# Patient Record
Sex: Female | Born: 1950 | Race: White | Hispanic: No
Health system: Southern US, Community
[De-identification: ages and names within clinical notes are randomized; demographics above are authoritative.]

## PROBLEM LIST (undated history)

## (undated) DIAGNOSIS — E559 Vitamin D deficiency, unspecified: Secondary | ICD-10-CM

## (undated) DIAGNOSIS — E785 Hyperlipidemia, unspecified: Secondary | ICD-10-CM

## (undated) DIAGNOSIS — R7303 Prediabetes: Secondary | ICD-10-CM

## (undated) DIAGNOSIS — F419 Anxiety disorder, unspecified: Secondary | ICD-10-CM

## (undated) DIAGNOSIS — C539 Malignant neoplasm of cervix uteri, unspecified: Secondary | ICD-10-CM

## (undated) DIAGNOSIS — M1990 Unspecified osteoarthritis, unspecified site: Secondary | ICD-10-CM

## (undated) DIAGNOSIS — C50412 Malignant neoplasm of upper-outer quadrant of left female breast: Secondary | ICD-10-CM

## (undated) DIAGNOSIS — C50919 Malignant neoplasm of unspecified site of unspecified female breast: Secondary | ICD-10-CM

## (undated) DIAGNOSIS — L309 Dermatitis, unspecified: Secondary | ICD-10-CM

## (undated) DIAGNOSIS — F32A Depression, unspecified: Secondary | ICD-10-CM

## (undated) DIAGNOSIS — E669 Obesity, unspecified: Secondary | ICD-10-CM

## (undated) DIAGNOSIS — F329 Major depressive disorder, single episode, unspecified: Secondary | ICD-10-CM

## (undated) DIAGNOSIS — I10 Essential (primary) hypertension: Secondary | ICD-10-CM

## (undated) DIAGNOSIS — T7840XA Allergy, unspecified, initial encounter: Secondary | ICD-10-CM

## (undated) HISTORY — DX: Allergy, unspecified, initial encounter: T78.40XA

## (undated) HISTORY — DX: Unspecified osteoarthritis, unspecified site: M19.90

## (undated) HISTORY — DX: Hyperlipidemia, unspecified: E78.5

## (undated) HISTORY — PX: TUBAL LIGATION: SHX77

## (undated) HISTORY — PX: ABDOMINAL HYSTERECTOMY: SHX81

## (undated) HISTORY — DX: Vitamin D deficiency, unspecified: E55.9

## (undated) HISTORY — DX: Essential (primary) hypertension: I10

## (undated) HISTORY — DX: Obesity, unspecified: E66.9

## (undated) HISTORY — DX: Anxiety disorder, unspecified: F41.9

## (undated) HISTORY — DX: Prediabetes: R73.03

## (undated) HISTORY — PX: CHOLECYSTECTOMY: SHX55

## (undated) HISTORY — DX: Dermatitis, unspecified: L30.9

## (undated) HISTORY — DX: Malignant neoplasm of cervix uteri, unspecified: C53.9

## (undated) HISTORY — DX: Malignant neoplasm of upper-outer quadrant of left female breast: C50.412

## (undated) HISTORY — DX: Depression, unspecified: F32.A

## (undated) HISTORY — DX: Major depressive disorder, single episode, unspecified: F32.9

---

## 1996-09-16 HISTORY — PX: ROUX-EN-Y PROCEDURE: SUR1287

## 2009-10-04 LAB — HM COLONOSCOPY: HM Colonoscopy: NEGATIVE

## 2011-10-05 LAB — HM MAMMOGRAPHY: HM Mammogram: NEGATIVE

## 2013-11-03 ENCOUNTER — Encounter: Payer: Self-pay | Admitting: Physician Assistant

## 2013-11-03 DIAGNOSIS — E669 Obesity, unspecified: Secondary | ICD-10-CM

## 2013-11-03 DIAGNOSIS — F32A Depression, unspecified: Secondary | ICD-10-CM

## 2013-11-03 DIAGNOSIS — T7840XA Allergy, unspecified, initial encounter: Secondary | ICD-10-CM

## 2013-11-03 DIAGNOSIS — E559 Vitamin D deficiency, unspecified: Secondary | ICD-10-CM

## 2013-11-03 DIAGNOSIS — F329 Major depressive disorder, single episode, unspecified: Secondary | ICD-10-CM

## 2013-11-03 DIAGNOSIS — M1990 Unspecified osteoarthritis, unspecified site: Secondary | ICD-10-CM

## 2013-11-03 DIAGNOSIS — E785 Hyperlipidemia, unspecified: Secondary | ICD-10-CM

## 2013-11-03 DIAGNOSIS — I10 Essential (primary) hypertension: Secondary | ICD-10-CM

## 2013-11-04 ENCOUNTER — Encounter: Payer: Self-pay | Admitting: Physician Assistant

## 2013-11-04 DIAGNOSIS — E1169 Type 2 diabetes mellitus with other specified complication: Secondary | ICD-10-CM | POA: Insufficient documentation

## 2013-11-04 DIAGNOSIS — Z9109 Other allergy status, other than to drugs and biological substances: Secondary | ICD-10-CM | POA: Insufficient documentation

## 2013-11-04 DIAGNOSIS — E559 Vitamin D deficiency, unspecified: Secondary | ICD-10-CM | POA: Insufficient documentation

## 2013-11-04 DIAGNOSIS — F32A Depression, unspecified: Secondary | ICD-10-CM | POA: Insufficient documentation

## 2013-11-04 DIAGNOSIS — E785 Hyperlipidemia, unspecified: Secondary | ICD-10-CM

## 2013-11-04 DIAGNOSIS — I10 Essential (primary) hypertension: Secondary | ICD-10-CM | POA: Insufficient documentation

## 2013-11-04 DIAGNOSIS — M1990 Unspecified osteoarthritis, unspecified site: Secondary | ICD-10-CM | POA: Insufficient documentation

## 2013-11-04 DIAGNOSIS — F329 Major depressive disorder, single episode, unspecified: Secondary | ICD-10-CM | POA: Insufficient documentation

## 2013-11-04 NOTE — Progress Notes (Signed)
Complete Physical HPI 63 y.o. female  presents for a complete physical. Her blood pressure has been controlled at home, today their BP is BP: 122/78 mmHg She denies chest pain, shortness of breath, dizziness.  Her cholesterol is diet controlled. In addition they are on Lipitor and denies myalgias. Her cholesterol is controlled.  She has been working on diet and exercise for prediabetes, and denies blurry vision, polydipsia, polyphagia and polyuria.  Patient is on Vitamin D supplement.    Current Medications:  Current Outpatient Prescriptions on File Prior to Visit  Medication Sig Dispense Refill  . acetaminophen (TYLENOL) 650 MG CR tablet Take 650 mg by mouth every 8 (eight) hours as needed for pain.      Marland Kitchen atorvastatin (LIPITOR) 20 MG tablet Take 20 mg by mouth daily.      . cetirizine (ZYRTEC) 10 MG tablet Take 10 mg by mouth daily.      Marland Kitchen losartan-hydrochlorothiazide (HYZAAR) 50-12.5 MG per tablet Take 1 tablet by mouth daily.      Marland Kitchen neomycin-polymyxin-hydrocortisone (CORTISPORIN) otic solution Place 3 drops into both ears 4 (four) times daily.      . sertraline (ZOLOFT) 100 MG tablet Take 300 mg by mouth daily.      . traZODone (DESYREL) 100 MG tablet Take 200 mg by mouth at bedtime.      . Vitamin D, Ergocalciferol, (DRISDOL) 50000 UNITS CAPS capsule Take 50,000 Units by mouth every 30 (thirty) days.       No current facility-administered medications on file prior to visit.   Health Maintenance:  Tetanus: unknown, likely greater than 10 years Pneumovax: N/A Flu vaccine: 06/2013 Zostavax: N/A Pap: not needed MGM: overdue DEXA: N/A Colonoscopy: will get records- per patient every 5 years and due in 1-2 years.  EGD:N/A  Allergies:  Allergies  Allergen Reactions  . Lisinopril     Diarrhea   Medical History:  Past Medical History  Diagnosis Date  . Hypertension   . Hyperlipidemia   . Obesity   . Allergy   . Depression   . Arthritis   . Cancer     Cervical   .  Vitamin D deficiency   . Eczema    Surgical History:  Past Surgical History  Procedure Laterality Date  . Cholecystectomy    . Abdominal hysterectomy      Partial, due to cervical cancer  . Roux-en-y procedure  1998  . Tubal ligation     Family History:  Family History  Problem Relation Age of Onset  . Stroke Mother   . Hyperlipidemia Mother   . Hypertension Mother   . Depression Mother   . Cancer Father     Melanoma  . Cancer Sister     bone   . Hypertension Sister   . Hyperlipidemia Sister   . Depression Sister   . Depression Brother   . Heart disease Brother   . Hyperlipidemia Brother   . Hypertension Brother   . COPD Brother   . Cancer Sister 73    breast cancer double masectomy  . Depression Sister   . Depression Sister    Social History:  History   Social History  . Marital Status: Married    Spouse Name: N/A    Number of Children: N/A  . Years of Education: N/A   Occupational History  . Not on file.   Social History Main Topics  . Smoking status: Passive Smoke Exposure - Never Smoker  . Smokeless tobacco: Never Used  .  Alcohol Use: No  . Drug Use: No  . Sexual Activity: Not Currently    Birth Control/ Protection: Post-menopausal   Other Topics Concern  . Not on file   Social History Narrative  . No narrative on file    Review of Systems: [X]  = yes, [ ]  = no   General: Fatigue [ ] ; Fever [ ] ; Chills [ ] ; Weakness [ ]   Insomnia [ ]  Eyes: Redness [ ]  Blurred vision [ ]  Diplopia [ ]   ENT: Congestion [ ]  Sinus Pain [ ]  Post Nasal Drip [ ]  Sore Throat [ ]  Earache [ ]   Cardiac: Chest pain/pressure [ ] ; SOB [ ] ; Orthopnea [ ] ;  Edema [ ] ; Palpitations [ ] ;  Paroxysmal nocturnal dyspnea[ ]  Claudication [ ]   Pulmonary: Cough [ ] ; Wheezing[ ] ; SOB [ ]   Snoring [ ]   GI: Nausea [ ]  Vomiting[ ] ; Dysphagia[ ] ; Heartburn[ ] ; Abdominal pain [ ] ; Constipation [ ] ; Diarrhea [ ] ; BRBPR [ ]  Melena[ ]  GU: Hematuria[ ] ; Dysuria [ ] ; Nocturia[ ]  Urgency [ ]    Hesitancy [ ]  Discharge [ ]  Neuro: Headaches[ ] ; Vertigo[ ] ; Seizures[ ] ; Paresthesias[ ] ;Blurred vision [ ] ; Diplopia [ ] ; Vision changes [ ]   Ortho: Arthritis Valu.Nieves ]; Joint pain [ ] ; Muscle pain [ ] ; Joint swelling [ ] ; Back Pain [ ] ; Skin:  Rash [ ]   Pruritis [ ]  Change in skin lesion [ ]   Psych: Depression[ ] ; Anxiety[ ]  confusion [ ]  Memory loss [ ]   Heme/Lypmh: Bleeding [ ] ; Bruising [ ] ; Enlarged lymph nodes [ ]   Endocrine: visual blurring [ ]  paresthesia [ ]  polyuria [ ]  polydypsea [ ]    Physical Exam: Estimated body mass index is 42.35 kg/(m^2) as calculated from the following:   Height as of this encounter: 5\' 3"  (1.6 m).   Weight as of this encounter: 239 lb (108.41 kg). Filed Vitals:   11/05/13 1127  BP: 122/78  Pulse: 72  Temp: 97.9 F (36.6 C)  Resp: 16   General Appearance: Well nourished, in no apparent distress. Eyes: PERRLA, EOMs, conjunctiva no swelling or erythema, normal fundi and vessels. Sinuses: No Frontal/maxillary tenderness ENT/Mouth: Ext aud canals clear, normal light reflex with TMs without erythema, bulging.  Good dentition. No erythema, swelling, or exudate on post pharynx. Tonsils not swollen or erythematous. Hearing normal.  Neck: Supple, thyroid normal. No bruits Respiratory: Respiratory effort normal, BS equal bilaterally without rales, rhonchi, wheezing or stridor. Cardio: RRR without murmurs, rubs or gallops. Brisk peripheral pulses without edema.  Chest: symmetric, with normal excursions and percussion. Breasts: Symmetric, without lumps, nipple discharge, retractions. Erythrema under bilateral breast Abdomen: Soft, obese, +BS. Non tender, no guarding, rebound, hernias, masses, or organomegaly. .  Lymphatics: Non tender without lymphadenopathy.  Genitourinary: defer Musculoskeletal: Full ROM all peripheral extremities,5/5 strength, and normal gait. Skin: Warm, dry without rashes, lesions, ecchymosis.  Neuro: Cranial nerves intact, reflexes equal  bilaterally. Normal muscle tone, no cerebellar symptoms. Sensation intact.  Psych: Awake and oriented X 3, normal affect, Insight and Judgment appropriate.   EKG: WNL no changes.  Assessment and Plan: Hypertension- continue meds  Hyperlipidemia- check level, continue meds for now  Obesity- phentermine 37.5  Allergy- OTC PRN  Depression- continue zoloft  Arthritis- states controlled with tylenol  Vitamin D deficiency- check level  Eczema- mainly in ear, controlled  Yeast- nystatin powder TDAP MGM Colonoscopy- get records, likely in 1-2 years.   Discussed med's effects and SE's. Screening labs and tests  as requested with regular follow-up as recommended.   Brooke Hoover 11:52 AM

## 2013-11-05 ENCOUNTER — Ambulatory Visit (INDEPENDENT_AMBULATORY_CARE_PROVIDER_SITE_OTHER): Payer: BC Managed Care – PPO | Admitting: Physician Assistant

## 2013-11-05 ENCOUNTER — Encounter: Payer: Self-pay | Admitting: Physician Assistant

## 2013-11-05 VITALS — BP 122/78 | HR 72 | Temp 97.9°F | Resp 16 | Ht 63.0 in | Wt 239.0 lb

## 2013-11-05 DIAGNOSIS — Z79899 Other long term (current) drug therapy: Secondary | ICD-10-CM

## 2013-11-05 DIAGNOSIS — E559 Vitamin D deficiency, unspecified: Secondary | ICD-10-CM

## 2013-11-05 DIAGNOSIS — Z0000 Encounter for general adult medical examination without abnormal findings: Secondary | ICD-10-CM

## 2013-11-05 DIAGNOSIS — Z23 Encounter for immunization: Secondary | ICD-10-CM

## 2013-11-05 LAB — BASIC METABOLIC PANEL WITH GFR
BUN: 10 mg/dL (ref 6–23)
CALCIUM: 9.4 mg/dL (ref 8.4–10.5)
CO2: 30 mEq/L (ref 19–32)
CREATININE: 0.54 mg/dL (ref 0.50–1.10)
Chloride: 100 mEq/L (ref 96–112)
GFR, Est African American: >89 mL/min
Glucose, Bld: 99 mg/dL (ref 70–99)
Potassium: 4.3 mEq/L (ref 3.5–5.3)
Sodium: 138 mEq/L (ref 135–145)

## 2013-11-05 LAB — HEPATIC FUNCTION PANEL
ALT: 23 U/L (ref 0–35)
AST: 23 U/L (ref 0–37)
Albumin: 4.5 g/dL (ref 3.5–5.2)
Alkaline Phosphatase: 72 U/L (ref 39–117)
BILIRUBIN TOTAL: 0.3 mg/dL (ref 0.2–1.2)
Bilirubin, Direct: 0.1 mg/dL (ref 0.0–0.3)
Indirect Bilirubin: 0.2 mg/dL (ref 0.2–1.2)
Total Protein: 6.6 g/dL (ref 6.0–8.3)

## 2013-11-05 LAB — URIC ACID: URIC ACID, SERUM: 5.2 mg/dL (ref 2.4–7.0)

## 2013-11-05 LAB — LIPID PANEL
CHOLESTEROL: 207 mg/dL — AB (ref 0–200)
HDL: 61 mg/dL (ref >39)
LDL Cholesterol: 110 mg/dL — ABNORMAL HIGH (ref 0–99)
Total CHOL/HDL Ratio: 3.4 Ratio
Triglycerides: 179 mg/dL — ABNORMAL HIGH (ref <150)
VLDL: 36 mg/dL (ref 0–40)

## 2013-11-05 LAB — CBC WITH DIFFERENTIAL/PLATELET
Basophils Absolute: 0 10*3/uL (ref 0.0–0.1)
Basophils Relative: 0 % (ref 0–1)
EOS ABS: 0.1 10*3/uL (ref 0.0–0.7)
EOS PCT: 1 % (ref 0–5)
HCT: 36.1 % (ref 36.0–46.0)
Hemoglobin: 12.5 g/dL (ref 12.0–15.0)
LYMPHS PCT: 33 % (ref 12–46)
Lymphs Abs: 2.1 10*3/uL (ref 0.7–4.0)
MCH: 28.7 pg (ref 26.0–34.0)
MCHC: 34.6 g/dL (ref 30.0–36.0)
MCV: 82.8 fL (ref 78.0–100.0)
Monocytes Absolute: 0.5 10*3/uL (ref 0.1–1.0)
Monocytes Relative: 8 % (ref 3–12)
Neutro Abs: 3.8 10*3/uL (ref 1.7–7.7)
Neutrophils Relative %: 58 % (ref 43–77)
PLATELETS: 320 10*3/uL (ref 150–400)
RBC: 4.36 MIL/uL (ref 3.87–5.11)
RDW: 15.3 % (ref 11.5–15.5)
WBC: 6.5 10*3/uL (ref 4.0–10.5)

## 2013-11-05 LAB — IRON AND TIBC
%SAT: 11 % — ABNORMAL LOW (ref 20–55)
Iron: 50 ug/dL (ref 42–145)
TIBC: 461 ug/dL (ref 250–470)
UIBC: 411 ug/dL — ABNORMAL HIGH (ref 125–400)

## 2013-11-05 LAB — TSH: TSH: 1.314 u[IU]/mL (ref 0.350–4.500)

## 2013-11-05 LAB — HEMOGLOBIN A1C
Hgb A1c MFr Bld: 6.3 % — ABNORMAL HIGH (ref <5.7)
Mean Plasma Glucose: 134 mg/dL — ABNORMAL HIGH (ref <117)

## 2013-11-05 LAB — FERRITIN: FERRITIN: 3 ng/mL — AB (ref 10–291)

## 2013-11-05 LAB — MAGNESIUM: Magnesium: 2 mg/dL (ref 1.5–2.5)

## 2013-11-05 LAB — VITAMIN B12: Vitamin B-12: 1323 pg/mL — ABNORMAL HIGH (ref 211–911)

## 2013-11-05 MED ORDER — PHENTERMINE HCL 37.5 MG PO TABS: 37.5000 mg | ORAL_TABLET | Freq: Every day | ORAL | Status: DC

## 2013-11-05 MED ORDER — NYSTATIN 100000 UNIT/GM EX POWD: CUTANEOUS | Status: DC

## 2013-11-05 NOTE — Patient Instructions (Signed)
Bad carbs also include fruit juice, alcohol, and sweet tea. These are empty calories that do not signal to your brain that you are full.   Please remember the good carbs are still carbs which convert into sugar. So please measure them out no more than 1/2-1 cup of rice, oatmeal, pasta, and beans.  Veggies are however free foods! Pile them on.   I like lean protein at every meal such as chicken, [place], pork chops, cottage cheese, etc. Just do not fry these meats and please center your meal around vegetable, the meats should be a side dish.   No all fruit is created equal. Please see the list below, the fruit at the bottom is higher in sugars than the fruit at the top   We want weight loss that will last so you should lose 1-2 pounds a week.  THAT IS IT! Please pick THREE things a month to change. Once it is a habit check off the item. Then pick another three items off the list to become habits.  If you are already doing a habit on the list GREAT!  Cross that item off! o Don't drink your calories. Ie, alcohol, soda, fruit juice, and sweet tea.  o Drink more water. Drink a glass when you feel hungry or before each meal.  o Eat breakfast - Complex carb and protein (likeDannon light and fit yogurt, oatmeal, fruit, eggs, [place] bacon). o Measure your cereal.  Eat no more than one cup a day. (ie [place]) o Eat an apple a day. o Add a vegetable a day. o Try a new vegetable a month. o Use Pam! Stop using oil or butter to cook. o Don't finish your plate or use smaller plates. o Share your dessert. o Eat sugar free Jello for dessert or frozen grapes. o Don't eat 2-3 hours before bed. o Switch to whole wheat bread, pasta, and brown rice. o Make healthier choices when you eat out. No fries! o Pick baked chicken, NOT fried. o Don't forget to SLOW DOWN when you eat. It is not going anywhere.  o Take the stairs. o Park far away in the parking lot o [employer] (or weights) for 10 minutes while  watching TV. o Walk at work for 10 minutes during break. o Walk outside 1 time a week with your friend, kids, dog, or significant other. o Start a walking group at [place] the mall as much as you can tolerate.  o Keep a food diary. o Weigh yourself daily. o Walk for 15 minutes 3 days per week. o Cook at home more often and eat out less.  If life happens and you go back to old habits, it is okay.  Just start over. You can do it!   If you experience chest pain, get short of breath, or tired during the exercise, please stop immediately and inform your doctor.   Preventative Care for Adults - Female      MAINTAIN REGULAR HEALTH EXAMS:  A routine yearly physical is a good way to check in with your primary care provider about your health and preventive screening. It is also an opportunity to share updates about your health and any concerns you have, and receive a thorough all-over exam.   Most health insurance companies pay for at least some preventative services.  Check with your health plan for specific coverages.  WHAT PREVENTATIVE SERVICES DO WOMEN NEED?  Adult women should have their weight and blood  pressure checked regularly.   Women age 65 and older should have their cholesterol levels checked regularly.  Women should be screened for cervical cancer with a Pap smear and pelvic exam beginning at either age 67, or 3 years after they become sexually activity.    Breast cancer screening generally begins at age 32 with a mammogram and breast exam by your primary care provider.    Beginning at age 52 and continuing to age 65, women should be screened for colorectal cancer.  Certain people may need continued testing until age 73.  Updating vaccinations is part of preventative care.  Vaccinations help protect against diseases such as the flu.  Osteoporosis is a disease in which the bones lose minerals and strength as we age. Women ages 68 and over should discuss this with their  caregivers, as should women after menopause who have other risk factors.  Lab tests are generally done as part of preventative care to screen for anemia and blood disorders, to screen for problems with the kidneys and liver, to screen for bladder problems, to check blood sugar, and to check your cholesterol level.  Preventative services generally include counseling about diet, exercise, avoiding tobacco, drugs, excessive alcohol consumption, and sexually transmitted infections.    GENERAL RECOMMENDATIONS FOR GOOD HEALTH:  Healthy diet:  Eat a variety of foods, including fruit, vegetables, animal or vegetable protein, such as meat, fish, chicken, and eggs, or beans, lentils, tofu, and grains, such as rice.  Drink plenty of water daily.  Decrease saturated fat in the diet, avoid lots of red meat, processed foods, sweets, fast foods, and fried foods.  Exercise:  Aerobic exercise helps maintain good heart health. At least 30-40 minutes of moderate-intensity exercise is recommended. For example, a brisk walk that increases your heart rate and breathing. This should be done on most days of the week.   Find a type of exercise or a variety of exercises that you enjoy so that it becomes a part of your daily life.  Examples are running, walking, swimming, water aerobics, and biking.  For motivation and support, explore group exercise such as aerobic class, spin class, Zumba, Yoga,or  martial arts, etc.    Set exercise goals for yourself, such as a certain weight goal, walk or run in a race such as a 5k walk/run.  Speak to your primary care provider about exercise goals.  Disease prevention:  If you smoke or chew tobacco, find out from your caregiver how to quit. It can literally save your life, no matter how long you have been a tobacco user. If you do not use tobacco, never begin.   Maintain a healthy diet and normal weight. Increased weight leads to problems with blood pressure and diabetes.    The Body Mass Index or BMI is a way of measuring how much of your body is fat. Having a BMI above 27 increases the risk of heart disease, diabetes, hypertension, stroke and other problems related to obesity. Your caregiver can help determine your BMI and based on it develop an exercise and dietary program to help you achieve or maintain this important measurement at a healthful level.  High blood pressure causes heart and blood vessel problems.  Persistent high blood pressure should be treated with medicine if weight loss and exercise do not work.   Fat and cholesterol leaves deposits in your arteries that can block them. This causes heart disease and vessel disease elsewhere in your body.  If your cholesterol is  found to be high, or if you have heart disease or certain other medical conditions, then you may need to have your cholesterol monitored frequently and be treated with medication.   Ask if you should have a cardiac stress test if your history suggests this. A stress test is a test done on a treadmill that looks for heart disease. This test can find disease prior to there being a problem.  Menopause can be associated with physical symptoms and risks. Hormone replacement therapy is available to decrease these. You should talk to your caregiver about whether starting or continuing to take hormones is right for you.   Osteoporosis is a disease in which the bones lose minerals and strength as we age. This can result in serious bone fractures. Risk of osteoporosis can be identified using a bone density scan. Women ages 22 and over should discuss this with their caregivers, as should women after menopause who have other risk factors. Ask your caregiver whether you should be taking a calcium supplement and Vitamin D, to reduce the rate of osteoporosis.   Avoid drinking alcohol in excess (more than two drinks per day).  Avoid use of street drugs. Do not share needles with anyone. Ask for professional  help if you need assistance or instructions on stopping the use of alcohol, cigarettes, and/or drugs.  Brush your teeth twice a day with fluoride toothpaste, and floss once a day. Good oral hygiene prevents tooth decay and gum disease. The problems can be painful, unattractive, and can cause other health problems. Visit your dentist for a routine oral and dental check up and preventive care every 6-12 months.   Look at your skin regularly.  Use a mirror to look at your back. Notify your caregivers of changes in moles, especially if there are changes in shapes, colors, a size larger than a pencil eraser, an irregular border, or development of new moles.  Safety:  Use seatbelts 100% of the time, whether driving or as a passenger.  Use safety devices such as hearing protection if you work in environments with loud noise or significant background noise.  Use safety glasses when doing any work that could send debris in to the eyes.  Use a helmet if you ride a bike or motorcycle.  Use appropriate safety gear for contact sports.  Talk to your caregiver about gun safety.  Use sunscreen with a SPF (or skin protection factor) of 15 or greater.  Lighter skinned people are at a greater risk of skin cancer. Don't forget to also wear sunglasses in order to protect your eyes from too much damaging sunlight. Damaging sunlight can accelerate cataract formation.   Practice safe sex. Use condoms. Condoms are used for birth control and to help reduce the spread of sexually transmitted infections (or STIs).  Some of the STIs are gonorrhea (the clap), chlamydia, syphilis, trichomonas, herpes, HPV (human papilloma virus) and HIV (human immunodeficiency virus) which causes AIDS. The herpes, HIV and HPV are viral illnesses that have no cure. These can result in disability, cancer and death.   Keep carbon monoxide and smoke detectors in your home functioning at all times. Change the batteries every 6 months or use a model that  plugs into the wall.   Vaccinations:  Stay up to date with your tetanus shots and other required immunizations. You should have a booster for tetanus every 10 years. Be sure to get your flu shot every year, since 5%-20% of the [place] population comes down with  the flu. The flu vaccine changes each year, so being vaccinated once is not enough. Get your shot in the fall, before the flu season peaks.   Other vaccines to consider:  Human Papilloma Virus or HPV causes cancer of the cervix, and other infections that can be transmitted from person to person. There is a vaccine for HPV, and females should get immunized between the ages of 22 and 39. It requires a series of 3 shots.   Pneumococcal vaccine to protect against certain types of pneumonia.  This is normally recommended for adults age 89 or older.  However, adults younger than 61 years old with certain underlying conditions such as diabetes, heart or lung disease should also receive the vaccine.  Shingles vaccine to protect against Varicella Zoster if you are older than age 34, or younger than 61 years old with certain underlying illness.  Hepatitis A vaccine to protect against a form of infection of the liver by a virus acquired from food.  Hepatitis B vaccine to protect against a form of infection of the liver by a virus acquired from blood or body fluids, particularly if you work in health care.  If you plan to travel internationally, check with your local health department for specific vaccination recommendations.  Cancer Screening:  Breast cancer screening is essential to preventive care for women. All women age 63 and older should perform a breast self-exam every month. At age 39 and older, women should have their caregiver complete a breast exam each year. Women at ages 46 and older should have a mammogram (x-ray film) of the breasts. Your caregiver can discuss how often you need mammograms.    Cervical cancer screening includes taking  a Pap smear (sample of cells examined under a microscope) from the cervix (end of the uterus). It also includes testing for HPV (Human Papilloma Virus, which can cause cervical cancer). Screening and a pelvic exam should begin at age 23, or 3 years after a woman becomes sexually active. Screening should occur every year, with a Pap smear but no HPV testing, up to age 29. After age 22, you should have a Pap smear every 3 years with HPV testing, if no HPV was found previously.   Most routine colon cancer screening begins at the age of 109. On a yearly basis, doctors may provide special easy to use take-home tests to check for hidden blood in the stool. Sigmoidoscopy or colonoscopy can detect the earliest forms of colon cancer and is life saving. These tests use a small camera at the end of a tube to directly examine the colon. Speak to your caregiver about this at age 74, when routine screening begins (and is repeated every 5 years unless early forms of pre-cancerous polyps or small growths are found).

## 2013-11-06 ENCOUNTER — Telehealth: Payer: Self-pay | Admitting: Physician Assistant

## 2013-11-06 LAB — URINALYSIS, ROUTINE W REFLEX MICROSCOPIC
Bilirubin Urine: NEGATIVE
Glucose, UA: NEGATIVE mg/dL
HGB URINE DIPSTICK: NEGATIVE
Ketones, ur: NEGATIVE mg/dL
LEUKOCYTES UA: NEGATIVE
NITRITE: NEGATIVE
PH: 7 (ref 5.0–8.0)
Protein, ur: NEGATIVE mg/dL
SPECIFIC GRAVITY, URINE: 1.009 (ref 1.005–1.030)
Urobilinogen, UA: 0.2 mg/dL (ref 0.0–1.0)

## 2013-11-06 LAB — MICROALBUMIN / CREATININE URINE RATIO
CREATININE, URINE: 41.5 mg/dL
Microalb Creat Ratio: 12 mg/g (ref 0.0–30.0)
Microalb, Ur: 0.5 mg/dL (ref 0.00–1.89)

## 2013-11-06 LAB — VITAMIN D 25 HYDROXY (VIT D DEFICIENCY, FRACTURES): VIT D 25 HYDROXY: 44 ng/mL (ref 30–89)

## 2013-11-06 LAB — INSULIN, FASTING: Insulin fasting, serum: 11 u[IU]/mL (ref 3–28)

## 2013-11-06 NOTE — Telephone Encounter (Signed)
Labs discussed with the patient, will follow up in 3 months.

## 2014-01-11 ENCOUNTER — Encounter (INDEPENDENT_AMBULATORY_CARE_PROVIDER_SITE_OTHER): Payer: Self-pay

## 2014-01-11 ENCOUNTER — Ambulatory Visit (HOSPITAL_COMMUNITY)
Admission: RE | Admit: 2014-01-11 | Discharge: 2014-01-11 | Disposition: A | Discharge status: Home / Self Care | Payer: BC Managed Care – PPO | Source: Ambulatory Visit | Attending: Physician Assistant | Admitting: Physician Assistant

## 2014-01-11 ENCOUNTER — Other Ambulatory Visit: Payer: Self-pay | Admitting: Physician Assistant

## 2014-01-11 DIAGNOSIS — M25569 Pain in unspecified knee: Secondary | ICD-10-CM | POA: Insufficient documentation

## 2014-01-11 DIAGNOSIS — M1710 Unilateral primary osteoarthritis, unspecified knee: Secondary | ICD-10-CM | POA: Insufficient documentation

## 2014-01-11 DIAGNOSIS — IMO0002 Reserved for concepts with insufficient information to code with codable children: Secondary | ICD-10-CM | POA: Insufficient documentation

## 2014-01-11 DIAGNOSIS — M25469 Effusion, unspecified knee: Secondary | ICD-10-CM | POA: Insufficient documentation

## 2014-01-11 DIAGNOSIS — M898X9 Other specified disorders of bone, unspecified site: Secondary | ICD-10-CM | POA: Insufficient documentation

## 2014-02-02 ENCOUNTER — Other Ambulatory Visit: Payer: Self-pay | Admitting: Physician Assistant

## 2014-02-02 DIAGNOSIS — Z1231 Encounter for screening mammogram for malignant neoplasm of breast: Secondary | ICD-10-CM

## 2014-02-04 ENCOUNTER — Ambulatory Visit (INDEPENDENT_AMBULATORY_CARE_PROVIDER_SITE_OTHER): Payer: BC Managed Care – PPO | Admitting: Physician Assistant

## 2014-02-04 ENCOUNTER — Encounter: Payer: Self-pay | Admitting: Physician Assistant

## 2014-02-04 VITALS — BP 120/70 | HR 64 | Temp 98.1°F | Resp 16 | Wt 222.0 lb

## 2014-02-04 DIAGNOSIS — R7303 Prediabetes: Secondary | ICD-10-CM

## 2014-02-04 DIAGNOSIS — E559 Vitamin D deficiency, unspecified: Secondary | ICD-10-CM

## 2014-02-04 DIAGNOSIS — E119 Type 2 diabetes mellitus without complications: Secondary | ICD-10-CM | POA: Insufficient documentation

## 2014-02-04 DIAGNOSIS — Z79899 Other long term (current) drug therapy: Secondary | ICD-10-CM

## 2014-02-04 DIAGNOSIS — M1990 Unspecified osteoarthritis, unspecified site: Secondary | ICD-10-CM

## 2014-02-04 DIAGNOSIS — R7309 Other abnormal glucose: Secondary | ICD-10-CM

## 2014-02-04 DIAGNOSIS — E785 Hyperlipidemia, unspecified: Secondary | ICD-10-CM

## 2014-02-04 DIAGNOSIS — M129 Arthropathy, unspecified: Secondary | ICD-10-CM

## 2014-02-04 DIAGNOSIS — I10 Essential (primary) hypertension: Secondary | ICD-10-CM

## 2014-02-04 DIAGNOSIS — E669 Obesity, unspecified: Secondary | ICD-10-CM

## 2014-02-04 LAB — CBC WITH DIFFERENTIAL/PLATELET
BASOS ABS: 0 10*3/uL (ref 0.0–0.1)
BASOS PCT: 0 % (ref 0–1)
EOS ABS: 0.1 10*3/uL (ref 0.0–0.7)
Eosinophils Relative: 1 % (ref 0–5)
HCT: 36.9 % (ref 36.0–46.0)
Hemoglobin: 12.8 g/dL (ref 12.0–15.0)
Lymphocytes Relative: 31 % (ref 12–46)
Lymphs Abs: 2.2 10*3/uL (ref 0.7–4.0)
MCH: 29.4 pg (ref 26.0–34.0)
MCHC: 34.7 g/dL (ref 30.0–36.0)
MCV: 84.8 fL (ref 78.0–100.0)
MONOS PCT: 7 % (ref 3–12)
Monocytes Absolute: 0.5 10*3/uL (ref 0.1–1.0)
NEUTROS ABS: 4.3 10*3/uL (ref 1.7–7.7)
NEUTROS PCT: 61 % (ref 43–77)
PLATELETS: 324 10*3/uL (ref 150–400)
RBC: 4.35 MIL/uL (ref 3.87–5.11)
RDW: 16 % — ABNORMAL HIGH (ref 11.5–15.5)
WBC: 7 10*3/uL (ref 4.0–10.5)

## 2014-02-04 LAB — HEMOGLOBIN A1C
Hgb A1c MFr Bld: 6.3 % — ABNORMAL HIGH (ref <5.7)
MEAN PLASMA GLUCOSE: 134 mg/dL — AB (ref <117)

## 2014-02-04 MED ORDER — CELECOXIB 200 MG PO CAPS: 200.0000 mg | ORAL_CAPSULE | Freq: Two times a day (BID) | ORAL | Status: DC

## 2014-02-04 MED ORDER — PHENTERMINE HCL 37.5 MG PO TABS: 37.5000 mg | ORAL_TABLET | Freq: Every day | ORAL | Status: DC

## 2014-02-04 MED ORDER — DICLOFENAC 35 MG PO CAPS: ORAL_CAPSULE | ORAL | Status: DC

## 2014-02-04 MED ORDER — ATORVASTATIN CALCIUM 40 MG PO TABS: 40.0000 mg | ORAL_TABLET | Freq: Every day | ORAL | Status: DC

## 2014-02-04 NOTE — Progress Notes (Signed)
Assessment and Plan:  Hypertension: Continue medication, monitor blood pressure at home. Continue DASH diet. Cholesterol: Continue diet and exercise. Check cholesterol.  Pre-diabetes-Continue diet and exercise. Check A1C Vitamin D Def- check level and continue medications.  Obesity with co morbidities- long discussion about weight loss, diet, and exercise  She is down 18 lbs from her last visit.   Will start the patient on phentermine- hand out given Insomnia- good sleep hygiene discussed, increase day time activity, continue trazodone OA- will try zorvolex and celebrex samples- does not want an injection at this time.   Continue diet and meds as discussed. Further disposition pending results of labs.  HPI 63 y.o. female  presents for 3 month follow up with hypertension, hyperlipidemia, prediabetes and vitamin D. Her blood pressure has been controlled at home, today their BP is BP: 120/70 mmHg She does workout, walking 5-6 days a week with her dog and active in her yard. She denies chest pain, shortness of breath, dizziness.  She is on cholesterol medication and denies myalgias. Her cholesterol is at goal. The cholesterol last visit was:   Lab Results  Component Value Date   CHOL 207* 11/05/2013   HDL 61 11/05/2013   LDLCALC 110* 11/05/2013   TRIG 179* 11/05/2013   CHOLHDL 3.4 11/05/2013   She has been working on diet and exercise for prediabetes, and denies paresthesia of the feet, polydipsia, polyuria and visual disturbances. Last A1C in the office was:  Lab Results  Component Value Date   HGBA1C 6.3* 11/05/2013   Patient is on Vitamin D supplement.   Has screening MGM scheduled for the 25th, she has had biopsies and dense breast in the past, I have encouraged her to get the new 3D MGM.  Her BMI is 39, and she is working very hard on weight loss. She states she has a lot of muscle and joint pain. She recently had xrays of both of her knees that show OA. She also complains of bilateral  shoulder pain R> L, she has cervical neck OA as well. She has had a trigger point injection in her left shoulder that did help. Her cervical neck OA is complicated by her pendulous breast, she uses nystatin powder under her breast for yeast especially during the summer and has indurations in bilateral shoulders from her bras. She states aleve does help her feet, knee, and neck pain some.   Current Medications:  Current Outpatient Prescriptions on File Prior to Visit  Medication Sig Dispense Refill  . acetaminophen (TYLENOL) 650 MG CR tablet Take 650 mg by mouth every 8 (eight) hours as needed for pain.      Marland Kitchen atorvastatin (LIPITOR) 20 MG tablet Take 20 mg by mouth daily.      . cetirizine (ZYRTEC) 10 MG tablet Take 10 mg by mouth daily.      Marland Kitchen losartan-hydrochlorothiazide (HYZAAR) 50-12.5 MG per tablet Take 1 tablet by mouth daily.      Marland Kitchen neomycin-polymyxin-hydrocortisone (CORTISPORIN) otic solution Place 3 drops into both ears 4 (four) times daily.      Marland Kitchen nystatin (MYCOSTATIN/NYSTOP) 100000 UNIT/GM POWD Apply daily after shower for yeast  30 g  4  . phentermine (ADIPEX-P) 37.5 MG tablet Take 1 tablet (37.5 mg total) by mouth daily before breakfast.  30 tablet  2  . sertraline (ZOLOFT) 100 MG tablet Take 300 mg by mouth daily.      . traZODone (DESYREL) 100 MG tablet Take 200 mg by mouth at bedtime.      Marland Kitchen  Vitamin D, Ergocalciferol, (DRISDOL) 50000 UNITS CAPS capsule Take 50,000 Units by mouth every 30 (thirty) days.       No current facility-administered medications on file prior to visit.   Medical History:  Past Medical History  Diagnosis Date  . Hypertension   . Hyperlipidemia   . Obesity   . Allergy   . Depression   . Arthritis   . Cancer     Cervical   . Vitamin D deficiency   . Eczema   . Prediabetes    Allergies:  Allergies  Allergen Reactions  . Lisinopril     Diarrhea     Review of Systems: [X]  = complains of  [ ]  = denies  General: Fatigue [ ]  Fever [ ]  Chills [ ]   Weakness [ ]   Insomnia [ ]  Eyes: Redness [ ]  Blurred vision [ ]  Diplopia [ ]   ENT: Congestion [ ]  Sinus Pain [ ]  Post Nasal Drip [ ]  Sore Throat [ ]  Earache [ ]   Cardiac: Chest pain/pressure [ ]  SOB [ ]  Orthopnea [ ]   Palpitations [ ]   Paroxysmal nocturnal dyspnea[ ]  Claudication [ ]  Edema [ ]   Pulmonary: Cough [ ]  Wheezing[ ]   SOB [ ]   Snoring [ ]   GI: Nausea [ ]  Vomiting[ ]  Dysphagia[ ]  Heartburn[ ]  Abdominal pain [ ]  Constipation [ ] ; Diarrhea [ ] ; BRBPR [ ]  Melena[ ]  GU: Hematuria[ ]  Dysuria [ ]  Nocturia[ ]  Urgency [ ]   Hesitancy [ ]  Discharge [ ]  Neuro: Headaches[ ]  Vertigo[ ]  Paresthesias[ ]  Spasm [ ]  Speech changes [ ]  Incoordination [ ]   Ortho: Arthritis [ ]  Joint pain [ ]  Muscle pain [ ]  Joint swelling [ ]  Back Pain [ ]  Skin:  Rash [ ]   Pruritis [ ]  Change in skin lesion [ ]   Psych: Depression[ ]  Anxiety[ ]  Confusion [ ]  Memory loss [ ]   Heme/Lypmh: Bleeding [ ]  Bruising [ ]  Enlarged lymph nodes [ ]   Endocrine: Visual blurring [ ]  Paresthesia [ ]  Polyuria [ ]  Polydypsea [ ]    Heat/cold intolerance [ ]  Hypoglycemia [ ]   Family history- Review and unchanged Social history- Review and unchanged Physical Exam: BP 120/70  Pulse 64  Temp(Src) 98.1 F (36.7 C)  Resp 16  Wt 222 lb (100.699 kg) Wt Readings from Last 3 Encounters:  02/04/14 222 lb (100.699 kg)  11/05/13 239 lb (108.41 kg)  Body mass index is 39.34 kg/(m^2).  General Appearance: Well nourished, in no apparent distress. Eyes: PERRLA, EOMs, conjunctiva no swelling or erythema Sinuses: No Frontal/maxillary tenderness ENT/Mouth: Ext aud canals clear, TMs without erythema, bulging. No erythema, swelling, or exudate on post pharynx.  Tonsils not swollen or erythematous. Hearing normal.  Neck: Supple, thyroid normal.  Respiratory: Respiratory effort normal, BS equal bilaterally without rales, rhonchi, wheezing or stridor.  Cardio: RRR with no MRGs. Brisk peripheral pulses without edema.  Abdomen: Soft, obsese, + BS.  Non  tender, no guarding, rebound, hernias, masses. Lymphatics: Non tender without lymphadenopathy.  Musculoskeletal: Full ROM, 5/5 strength, antalgic gait, + pinpoint tenderness right shoulder.  Skin: Warm, dry without rashes, lesions, ecchymosis.  Neuro: Cranial nerves intact. Normal muscle tone, no cerebellar symptoms. Sensation intact.  Psych: Awake and oriented X 3, normal affect, Insight and Judgment appropriate.   Vicie Mutters 11:11 AM

## 2014-02-04 NOTE — Patient Instructions (Signed)

## 2014-02-05 ENCOUNTER — Telehealth: Payer: Self-pay | Admitting: Physician Assistant

## 2014-02-05 LAB — BASIC METABOLIC PANEL WITH GFR
BUN: 12 mg/dL (ref 6–23)
CALCIUM: 9.8 mg/dL (ref 8.4–10.5)
CO2: 29 mEq/L (ref 19–32)
Chloride: 101 mEq/L (ref 96–112)
Creat: 0.59 mg/dL (ref 0.50–1.10)
GFR, Est African American: >89 mL/min
GLUCOSE: 117 mg/dL — AB (ref 70–99)
POTASSIUM: 4.4 meq/L (ref 3.5–5.3)
Sodium: 140 mEq/L (ref 135–145)

## 2014-02-05 LAB — HEPATIC FUNCTION PANEL
ALT: 24 U/L (ref 0–35)
AST: 24 U/L (ref 0–37)
Albumin: 4.2 g/dL (ref 3.5–5.2)
Alkaline Phosphatase: 80 U/L (ref 39–117)
BILIRUBIN DIRECT: 0.1 mg/dL (ref 0.0–0.3)
BILIRUBIN INDIRECT: 0.3 mg/dL (ref 0.2–1.2)
BILIRUBIN TOTAL: 0.4 mg/dL (ref 0.2–1.2)
Total Protein: 6.9 g/dL (ref 6.0–8.3)

## 2014-02-05 LAB — LIPID PANEL
Cholesterol: 168 mg/dL (ref 0–200)
HDL: 51 mg/dL (ref >39)
LDL CALC: 92 mg/dL (ref 0–99)
Total CHOL/HDL Ratio: 3.3 Ratio
Triglycerides: 127 mg/dL (ref <150)
VLDL: 25 mg/dL (ref 0–40)

## 2014-02-05 LAB — TSH: TSH: 0.549 u[IU]/mL (ref 0.350–4.500)

## 2014-02-05 LAB — MAGNESIUM: Magnesium: 2 mg/dL (ref 1.5–2.5)

## 2014-02-05 LAB — INSULIN, FASTING: INSULIN FASTING, SERUM: 15 u[IU]/mL (ref 3–28)

## 2014-02-05 LAB — VITAMIN D 25 HYDROXY (VIT D DEFICIENCY, FRACTURES): VIT D 25 HYDROXY: 44 ng/mL (ref 30–89)

## 2014-02-05 NOTE — Telephone Encounter (Signed)
All of your labs are normal except: Your AIC is in prediabetic range which is between 5.7 and 6.4. This is a warning sign for diabetes. Your A1C is a measure of your sugar over the past 3 months and is not affected by what you have eaten over the past few days. Diabetes increases your chances of stroke and heart attack over 300 % and is the leading cause of blindness and kidney failure in the [place]. Please make sure you decrease bad carbs like white bread, white rice, potatoes, corn, soft drinks, pasta, cereals, refined sugars, sweet tea, dried fruits, and fruit juice. Good carbs are okay to eat in moderation like sweet potatoes, brown rice, whole grain pasta/bread, most fruit (except dried fruit) and you can eat as many veggies as you want.

## 2014-02-08 ENCOUNTER — Ambulatory Visit (HOSPITAL_COMMUNITY)
Admission: RE | Admit: 2014-02-08 | Discharge: 2014-02-08 | Disposition: A | Discharge status: Home / Self Care | Payer: BC Managed Care – PPO | Source: Ambulatory Visit | Attending: Physician Assistant | Admitting: Physician Assistant

## 2014-02-08 ENCOUNTER — Other Ambulatory Visit: Payer: Self-pay | Admitting: Physician Assistant

## 2014-02-08 DIAGNOSIS — Z1231 Encounter for screening mammogram for malignant neoplasm of breast: Secondary | ICD-10-CM | POA: Insufficient documentation

## 2014-02-11 ENCOUNTER — Telehealth: Payer: Self-pay | Admitting: Physician Assistant

## 2014-02-11 NOTE — Telephone Encounter (Signed)
Discussed with patient that she will need diagnostic left breast MGM. She understands, we will schedule Monday.

## 2014-02-14 ENCOUNTER — Other Ambulatory Visit: Payer: Self-pay | Admitting: Physician Assistant

## 2014-02-14 DIAGNOSIS — R928 Other abnormal and inconclusive findings on diagnostic imaging of breast: Secondary | ICD-10-CM

## 2014-02-14 MED ORDER — HYDROCHLOROTHIAZIDE 25 MG PO TABS: 25.0000 mg | ORAL_TABLET | Freq: Every day | ORAL | Status: DC | PRN

## 2014-02-16 ENCOUNTER — Encounter (INDEPENDENT_AMBULATORY_CARE_PROVIDER_SITE_OTHER): Payer: Self-pay

## 2014-02-16 ENCOUNTER — Other Ambulatory Visit: Payer: Self-pay | Admitting: Physician Assistant

## 2014-02-16 ENCOUNTER — Ambulatory Visit
Admission: RE | Admit: 2014-02-16 | Discharge: 2014-02-16 | Disposition: A | Discharge status: Home / Self Care | Payer: BC Managed Care – PPO | Source: Ambulatory Visit | Attending: Physician Assistant | Admitting: Physician Assistant

## 2014-02-16 DIAGNOSIS — R928 Other abnormal and inconclusive findings on diagnostic imaging of breast: Secondary | ICD-10-CM

## 2014-02-25 ENCOUNTER — Ambulatory Visit
Admission: RE | Admit: 2014-02-25 | Discharge: 2014-02-25 | Disposition: A | Discharge status: Home / Self Care | Payer: BC Managed Care – PPO | Source: Ambulatory Visit | Attending: Physician Assistant | Admitting: Physician Assistant

## 2014-02-25 DIAGNOSIS — R928 Other abnormal and inconclusive findings on diagnostic imaging of breast: Secondary | ICD-10-CM

## 2014-02-28 ENCOUNTER — Other Ambulatory Visit: Payer: Self-pay | Admitting: Physician Assistant

## 2014-02-28 DIAGNOSIS — C50919 Malignant neoplasm of unspecified site of unspecified female breast: Secondary | ICD-10-CM

## 2014-03-04 ENCOUNTER — Ambulatory Visit
Admission: RE | Admit: 2014-03-04 | Discharge: 2014-03-04 | Disposition: A | Discharge status: Home / Self Care | Payer: BC Managed Care – PPO | Source: Ambulatory Visit | Attending: Physician Assistant | Admitting: Physician Assistant

## 2014-03-04 ENCOUNTER — Telehealth: Payer: Self-pay | Admitting: *Deleted

## 2014-03-04 DIAGNOSIS — C50919 Malignant neoplasm of unspecified site of unspecified female breast: Secondary | ICD-10-CM

## 2014-03-04 MED ORDER — GADOBENATE DIMEGLUMINE 529 MG/ML IV SOLN
20.0000 mL | Freq: Once | INTRAVENOUS | Status: AC | PRN
  Administered 2014-03-04: 20 mL via INTRAVENOUS

## 2014-03-04 NOTE — Telephone Encounter (Signed)
Confirmed [facility] for 03/09/14 at 1230 .  Instructions and contact information given.

## 2014-03-07 ENCOUNTER — Telehealth: Payer: Self-pay | Admitting: *Deleted

## 2014-03-07 ENCOUNTER — Other Ambulatory Visit: Payer: Self-pay | Admitting: Physician Assistant

## 2014-03-07 DIAGNOSIS — R928 Other abnormal and inconclusive findings on diagnostic imaging of breast: Secondary | ICD-10-CM

## 2014-03-07 DIAGNOSIS — C50412 Malignant neoplasm of upper-outer quadrant of left female breast: Secondary | ICD-10-CM | POA: Insufficient documentation

## 2014-03-07 HISTORY — DX: Malignant neoplasm of upper-outer quadrant of left female breast: C50.412

## 2014-03-07 NOTE — Telephone Encounter (Signed)
Spoke with patient and confirmed new time for 03/09/14 at 12N.  Patient verbalized understanding.

## 2014-03-09 ENCOUNTER — Ambulatory Visit (HOSPITAL_BASED_OUTPATIENT_CLINIC_OR_DEPARTMENT_OTHER): Payer: BC Managed Care – PPO | Admitting: Hematology and Oncology

## 2014-03-09 ENCOUNTER — Other Ambulatory Visit (HOSPITAL_BASED_OUTPATIENT_CLINIC_OR_DEPARTMENT_OTHER): Payer: BC Managed Care – PPO

## 2014-03-09 ENCOUNTER — Encounter (INDEPENDENT_AMBULATORY_CARE_PROVIDER_SITE_OTHER): Payer: Self-pay | Admitting: General Surgery

## 2014-03-09 ENCOUNTER — Encounter (INDEPENDENT_AMBULATORY_CARE_PROVIDER_SITE_OTHER): Payer: Self-pay

## 2014-03-09 ENCOUNTER — Ambulatory Visit
Admission: RE | Admit: 2014-03-09 | Discharge: 2014-03-09 | Disposition: A | Discharge status: Home / Self Care | Payer: BC Managed Care – PPO | Source: Ambulatory Visit | Attending: Radiation Oncology | Admitting: Radiation Oncology

## 2014-03-09 ENCOUNTER — Encounter: Payer: Self-pay | Admitting: Hematology and Oncology

## 2014-03-09 ENCOUNTER — Ambulatory Visit: Payer: BC Managed Care – PPO

## 2014-03-09 ENCOUNTER — Ambulatory Visit (HOSPITAL_BASED_OUTPATIENT_CLINIC_OR_DEPARTMENT_OTHER): Payer: BC Managed Care – PPO | Admitting: General Surgery

## 2014-03-09 VITALS — BP 154/82 | HR 103 | Temp 98.1°F | Resp 20 | Ht 63.0 in | Wt 221.5 lb

## 2014-03-09 DIAGNOSIS — C50412 Malignant neoplasm of upper-outer quadrant of left female breast: Secondary | ICD-10-CM

## 2014-03-09 DIAGNOSIS — D0590 Unspecified type of carcinoma in situ of unspecified breast: Secondary | ICD-10-CM

## 2014-03-09 DIAGNOSIS — Z170 Estrogen receptor positive status [ER+]: Secondary | ICD-10-CM

## 2014-03-09 DIAGNOSIS — C50419 Malignant neoplasm of upper-outer quadrant of unspecified female breast: Secondary | ICD-10-CM

## 2014-03-09 LAB — COMPREHENSIVE METABOLIC PANEL (CC13)
ALBUMIN: 4.2 g/dL (ref 3.5–5.0)
ALT: 28 U/L (ref 0–55)
AST: 30 U/L (ref 5–34)
Alkaline Phosphatase: 92 U/L (ref 40–150)
Anion Gap: 10 mEq/L (ref 3–11)
BUN: 9.9 mg/dL (ref 7.0–26.0)
CO2: 30 mEq/L — ABNORMAL HIGH (ref 22–29)
CREATININE: 0.8 mg/dL (ref 0.6–1.1)
Calcium: 10.2 mg/dL (ref 8.4–10.4)
Chloride: 100 mEq/L (ref 98–109)
Glucose: 120 mg/dl (ref 70–140)
POTASSIUM: 4.1 meq/L (ref 3.5–5.1)
Sodium: 139 mEq/L (ref 136–145)
Total Bilirubin: 0.41 mg/dL (ref 0.20–1.20)
Total Protein: 7.5 g/dL (ref 6.4–8.3)

## 2014-03-09 LAB — CBC WITH DIFFERENTIAL/PLATELET
BASO%: 0.5 % (ref 0.0–2.0)
Basophils Absolute: 0 10*3/uL (ref 0.0–0.1)
EOS%: 0.6 % (ref 0.0–7.0)
Eosinophils Absolute: 0 10*3/uL (ref 0.0–0.5)
HCT: 40.1 % (ref 34.8–46.6)
HEMOGLOBIN: 13.3 g/dL (ref 11.6–15.9)
LYMPH%: 28.9 % (ref 14.0–49.7)
MCH: 29.3 pg (ref 25.1–34.0)
MCHC: 33.1 g/dL (ref 31.5–36.0)
MCV: 88.5 fL (ref 79.5–101.0)
MONO#: 0.6 10*3/uL (ref 0.1–0.9)
MONO%: 6.8 % (ref 0.0–14.0)
NEUT#: 5.3 10*3/uL (ref 1.5–6.5)
NEUT%: 63.2 % (ref 38.4–76.8)
Platelets: 335 10*3/uL (ref 145–400)
RBC: 4.53 10*6/uL (ref 3.70–5.45)
RDW: 15.1 % — AB (ref 11.2–14.5)
WBC: 8.4 10*3/uL (ref 3.9–10.3)
lymph#: 2.4 10*3/uL (ref 0.9–3.3)

## 2014-03-09 NOTE — Progress Notes (Addendum)
Patient ID: Brooke Hoover, female   DOB: 28-Aug-1951, 63 y.o.   MRN: [id number]  No chief complaint on file.   HPI Brooke Hoover is a 63 y.o. female.  We are asked to see the patient in consultation by Dr. 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 The patient is a 63yo wf who presents after screening mammogram showed some abnormal calcifications in the upper outer left breast. This measured 5.9cm. 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 The MRI showed the area to measure 9.3cm. She denies any breast pain or discharge from her nipple.  She does complain of pain in her shoulders because of the weight of her breasts. HPI  Past Medical History  Diagnosis Date  . Hypertension   . Hyperlipidemia   . Obesity   . Allergy   . Depression   . Arthritis   . Cancer     Cervical   . Vitamin D deficiency   . Eczema   . Prediabetes   . Anxiety   . Cervical cancer     Past Surgical History  Procedure Laterality Date  . Cholecystectomy    . Abdominal hysterectomy      Partial, due to cervical cancer  . Roux-en-y procedure  1998  . Tubal ligation      Family History  Problem Relation Age of Onset  . Stroke Mother   . Hyperlipidemia Mother   . Hypertension Mother   . Depression Mother   . Cancer Father     Melanoma  . Cancer Sister     bone   . Hypertension Sister   . Hyperlipidemia Sister   . Depression Sister   . Depression Brother   . Heart disease Brother   . Hyperlipidemia Brother   . Hypertension Brother   . COPD Brother   . Cancer Sister 73    breast cancer double masectomy  . Depression Sister   . Depression Sister     Social History History  Substance Use Topics  . Smoking status: Passive Smoke Exposure - Never Smoker  . Smokeless tobacco: Never Used  . Alcohol Use: No    Allergies  Allergen Reactions  . Lisinopril Diarrhea    Pt. Stated, "ACE inhibitors give me diarrhea"    Current Outpatient Prescriptions  Medication Sig Dispense  Refill  . acetaminophen (TYLENOL) 650 MG CR tablet Take 650 mg by mouth every 8 (eight) hours as needed for pain.      Marland Kitchen atorvastatin (LIPITOR) 40 MG tablet Take 1 tablet (40 mg total) by mouth daily.  90 tablet  0  . BIOTIN PO Take 1 tablet by mouth daily.      . celecoxib (CELEBREX) 200 MG capsule Take 1 capsule (200 mg total) by mouth 2 (two) times daily.  60 capsule  2  . cetirizine (ZYRTEC) 10 MG tablet Take 10 mg by mouth daily.      . Diclofenac (ZORVOLEX) 35 MG CAPS 1 pill three times daily as needed for pain  90 capsule  4  . losartan-hydrochlorothiazide (HYZAAR) 50-12.5 MG per tablet Take 1 tablet by mouth daily.      . Melatonin 1 MG CAPS Take 3 mg by mouth daily.      . Multiple Vitamins-Minerals (MULTIVITAMIN PO) Take 1 tablet by mouth daily.      Marland Kitchen neomycin-polymyxin-hydrocortisone (CORTISPORIN) otic solution Place 3 drops into both ears 4 (four) times daily.      Marland Kitchen nystatin (MYCOSTATIN/NYSTOP) 100000  UNIT/GM POWD Apply daily after shower for yeast  30 g  4  . phentermine (ADIPEX-P) 37.5 MG tablet TAKE ONE TABLET BY MOUTH ONCE DAILY BEFORE BREAKFAST.  30 tablet  2  . sertraline (ZOLOFT) 100 MG tablet Take 300 mg by mouth daily.      . traZODone (DESYREL) 100 MG tablet Take 200 mg by mouth at bedtime.      . Vitamin D, Ergocalciferol, (DRISDOL) 50000 UNITS CAPS capsule Take 50,000 Units by mouth every 30 (thirty) days.       No current facility-administered medications for this visit.    Review of Systems Review of Systems  Constitutional: Negative.   HENT: Negative.   Eyes: Negative.   Respiratory: Negative.   Cardiovascular: Negative.   Gastrointestinal: Negative.   Endocrine: Negative.   Genitourinary: Negative.   Musculoskeletal: Positive for back pain.  Skin: Negative.   Allergic/Immunologic: Negative.   Neurological: Negative.   Hematological: Negative.   Psychiatric/Behavioral: Negative.     There were no vitals taken for this visit.  Physical  Exam Physical Exam  Constitutional: She is oriented to person, place, and time. She appears well-developed and well-nourished.  HENT:  Head: Normocephalic and atraumatic.  Eyes: Conjunctivae and EOM are normal. Pupils are equal, round, and reactive to light.  Neck: Normal range of motion. Neck supple.  Cardiovascular: Normal rate, regular rhythm and normal heart sounds.   Pulmonary/Chest: Effort normal and breath sounds normal.  There is a palpable bruise in the lateral aspect of the left breast. There is no palpable mass in the right breast. There is no palpable axillary, supraclavicular, or cervical lymphadenopathy. She does have extremely large breasts bilaterally  Abdominal: Soft. Bowel sounds are normal.  Musculoskeletal: Normal range of motion.  Lymphadenopathy:    She has no cervical adenopathy.  Neurological: She is alert and oriented to person, place, and time.  Skin: Skin is warm and dry.  Psychiatric: She has a normal mood and affect. Her behavior is normal.    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 I have talked to her in detail about the different options for treatment. Given the size of the area I think she would be best treated with mastectomy and sentinel node evaluation. I have discussed with her the risks and benefits of surgery as well as some of the technical aspects and she understands and wishes to proceed. We also talked about reconstruction but she would like to wait on this for now.  Because of the cumbersome size of her breasts and her own personal discomfort she desires bilateral mastectomy and I think this is reasonable for her.    Plan    Plan for bilateral mastectomy and left sentinel node mapping       TOTH III,PAUL S 03/09/2014, 1:54 PM

## 2014-03-09 NOTE — Progress Notes (Signed)
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 Patient is seen in multidisciplinary breast clinic today   HPI Brooke Hoover is a 63 y.o. female.  with history of hypertension, hyperlipidemia, depression, arthritis who had a routine screening mammogram in 02/08/2014 that revealed left breast calcifications. 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 It is positive for Estrogen Receptor: 100%, POSITIVE, Progesterone Receptor: 92%, POSITIVE. Bilateral breast MRI performed in 03/04/2014 revealed approximately 4.5x9.3x4.8cm. clumped non-mass enhancement over the central left breast from 3:00 position 2:00 position corresponding to her  biopsy site.   She does not have any history of tobacco abuse or alcohol use. She attained her menarche at the age of 39 or 75. She had her first child when she was 6 years old. She is not on any hormonal replacement therapy. She underwent hysterectomy at the age of 61 for cervical cancer. She did not require chemotherapy/ radiation at that time.  She complains of heaviness in both the breast and shoulder pain because of the breast weight. She denies any weight loss or decrease in her appetite. Denies any dizziness headaches or blurred vision, fevers, shortness of breath, chest pain, palpitations, blood in the stool or blood in the urine, tingling or numbness.   Past Medical History  Diagnosis Date  . Hypertension   . Hyperlipidemia   . Obesity   . Allergy   . Depression   . Arthritis   . Cancer     Cervical   . Vitamin D  deficiency   . Eczema   . Prediabetes   . Anxiety   . Cervical cancer     Past Surgical History  Procedure Laterality Date  . Cholecystectomy    . Abdominal hysterectomy      Partial, due to cervical cancer  . Roux-en-y procedure  1998  . Tubal ligation      Family History  Problem Relation Age of Onset  . Stroke Mother   . Hyperlipidemia Mother   . Hypertension Mother   . Depression Mother   . Cancer Father     Melanoma  . Cancer Sister     bone   . Hypertension Sister   . Hyperlipidemia Sister   . Depression Sister   . Depression Brother   . Heart disease Brother   . Hyperlipidemia Brother   . Hypertension Brother   . COPD Brother   . Cancer Sister 35    breast cancer double masectomy  . Depression Sister   . Depression Sister     Social History History  Substance Use Topics  . Smoking status: Passive Smoke Exposure - Never Smoker  . Smokeless tobacco: Never Used  . Alcohol Use: No    Allergies  Allergen Reactions  . Lisinopril Diarrhea    Pt. Stated, "ACE inhibitors give me diarrhea"    Current Outpatient Prescriptions  Medication Sig Dispense Refill  . acetaminophen (TYLENOL) 650 MG CR tablet Take 650 mg by mouth every 8 (eight) hours as needed for pain.      Marland Kitchen atorvastatin (LIPITOR) 40 MG tablet Take 1 tablet (40 mg total) by mouth daily.  90 tablet  0  .  BIOTIN PO Take 1 tablet by mouth daily.      . celecoxib (CELEBREX) 200 MG capsule Take 1 capsule (200 mg total) by mouth 2 (two) times daily.  60 capsule  2  . cetirizine (ZYRTEC) 10 MG tablet Take 10 mg by mouth daily.      . Diclofenac (ZORVOLEX) 35 MG CAPS 1 pill three times daily as needed for pain  90 capsule  4  . losartan-hydrochlorothiazide (HYZAAR) 50-12.5 MG per tablet Take 1 tablet by mouth daily.      . Melatonin 1 MG CAPS Take 3 mg by mouth daily.      . Multiple Vitamins-Minerals (MULTIVITAMIN PO) Take 1 tablet by mouth daily.      Marland Kitchen neomycin-polymyxin-hydrocortisone (CORTISPORIN)  otic solution Place 3 drops into both ears 4 (four) times daily.      Marland Kitchen nystatin (MYCOSTATIN/NYSTOP) 100000 UNIT/GM POWD Apply daily after shower for yeast  30 g  4  . phentermine (ADIPEX-P) 37.5 MG tablet TAKE ONE TABLET BY MOUTH ONCE DAILY BEFORE BREAKFAST.  30 tablet  2  . sertraline (ZOLOFT) 100 MG tablet Take 300 mg by mouth daily.      . traZODone (DESYREL) 100 MG tablet Take 200 mg by mouth at bedtime.      . Vitamin D, Ergocalciferol, (DRISDOL) 50000 UNITS CAPS capsule Take 50,000 Units by mouth every 30 (thirty) days.       No current facility-administered medications for this visit.    Health maintenance: She is up-to-date with her colonoscopy. last colonoscopy was 2 years ago Status post hysterectomy for cervical cancer recent pelvic exam and Pap smear was E.   Review of Systems A detailed 14 point review of systems is been assessed and pertinent symptoms as mentioned in history of present illness   Physical Exam  GENERAL:alert and oriented x3,  no distress, well nourished and well developed SKIN: no rashes or significant lesions HEAD: Normocephalic, atraumatic EYES: PERRLA, EOMI, Conjunctiva are pink and non-injected, sclera clear EARS: External ears normal OROPHARYNX:no erythema, lips, buccal mucosa, and tongue normal and mucous membranes are moist  NECK: supple, no adenopathy, no JVD, no stridor, non-tender LYMPH:  no palpable lymphadenopathy, no hepatosplenomegaly BREAST: both the breasts are larger but  left breast slightly larger than the right . purpura noted at the biopsy site.no masses noted in both the breasts. No bilateral axillary lymphadenopathy appreciated  LUNGS: clear to auscultation , coarse sounds heard HEART: regular rate & rhythm ABDOMEN: soft, obese and normal bowel sounds BACK: symmetric, no curvature. EXTREMITIES:no edema, no clubbing and no cyanosis  NEURO: alert & oriented x 3 with fluent speech, no focal motor/sensory deficits, gait  normal   PERFORMANCE STATUS: ECOG 0 - Asymptomatic   LABORATORY DATA: Lab Results  Component Value Date   WBC 8.4 03/09/2014   HGB 13.3 03/09/2014   HCT 40.1 03/09/2014   MCV 88.5 03/09/2014   PLT 335 03/09/2014      Chemistry      Component Value Date/Time   NA 139 03/09/2014 1210   NA 140 02/04/2014 1126   K 4.1 03/09/2014 1210   K 4.4 02/04/2014 1126   CL 101 02/04/2014 1126   CO2 30* 03/09/2014 1210   CO2 29 02/04/2014 1126   BUN 9.9 03/09/2014 1210   BUN 12 02/04/2014 1126   CREATININE 0.8 03/09/2014 1210   CREATININE 0.59 02/04/2014 1126      Component Value Date/Time   CALCIUM 10.2 03/09/2014 1210   CALCIUM 9.8  02/04/2014 1126   ALKPHOS 92 03/09/2014 1210   ALKPHOS 80 02/04/2014 1126   AST 30 03/09/2014 1210   AST 24 02/04/2014 1126   ALT 28 03/09/2014 1210   ALT 24 02/04/2014 1126   BILITOT 0.41 03/09/2014 1210   BILITOT 0.4 02/04/2014 1126       RADIOGRAPHIC STUDIES: Mr Breast Bilateral W Wo Contrast  03/04/2014   CLINICAL DATA:  Patient is post malignant stereotactic left breast biopsy for microcalcifications demonstrating DCIS.  LABS:  BUN/creatinine 12/0.59 with GFR greater than 89 performed at [facility] 02/04/14.  EXAM: BILATERAL BREAST MRI WITH AND WITHOUT CONTRAST  TECHNIQUE: Multiplanar, multisequence MR images of both breasts were obtained prior to and following the intravenous administration of 20ml of MultiHance.  THREE-DIMENSIONAL MR IMAGE RENDERING ON INDEPENDENT WORKSTATION:  Three-dimensional MR images were rendered by post-processing of the original MR data on an independent workstation. The three-dimensional MR images were interpreted, and findings are reported in the following complete MRI report for this study. Three dimensional images were evaluated at the independent DynaCad workstation  COMPARISON:  Previous exams  FINDINGS: Breast composition: b.  Scattered fibroglandular tissue.  Background parenchymal enhancement: Mild.  Right breast: No suspicious mass or  abnormal enhancement.  Left breast: Clip artifact is present over the slightly outer mid portion of the left breast from patient's recent biopsy. Clip artifact is also present over the inner mid portion of the breast from previous benign biopsy. A 1.2 cm hematoma is present at the recent biopsy site over the slightly outer mid portion of the left breast at approximately the 3 o'clock position.  There is a region of clumped non mass enhancement over the left breast predominately around the 3 o'clock position extending towards the upper outer quadrant to the approximate 12 o'clock position and covering an area measuring approximately 4.5 x 9.3 x 4.8 cm in its transverse, AP and craniocaudal dimensions. Patient's recent malignant biopsy site lies centrally within this clumped non mass enhancement. This enhancement begins approximately 2.7 cm posterior to the nipple.  Lymph nodes: No abnormal appearing lymph nodes.  Ancillary findings:  None.  IMPRESSION: Region of clumped non mass enhancement over the central left breast from the 3 o'clock towards the 12 o'clock position measuring approximately 4.5 x 9.3 x 4.8 cm in its transverse, AP and craniocaudal dimensions corresponding to patient's biopsy proven DCIS with clip artifact located centrally within this abnormal enhancement. This MR enhancement appears slightly more extensive than the calcifications visualized mammographically.  RECOMMENDATION: If considering breast conservation surgery, MRI guided biopsy could be performed along the posterior extent of this enhancement to document extent of disease. Otherwise, recommend continued follow-up as per clinical treatment plan.  BI-RADS CATEGORY  6: Known biopsy-proven malignancy.   Electronically Signed   By: Marin Olp M.D.   On: 03/04/2014 16:42   Mm Digital Diagnostic Unilat L  02/16/2014   CLINICAL DATA:  Left breast calcifications at recent screening mammography.  EXAM: DIGITAL DIAGNOSTIC  LEFT MAMMOGRAM   COMPARISON:  Previous examinations, including the screening mammogram dated 02/08/2014.  ACR Breast Density Category b: There are scattered areas of fibroglandular density.  FINDINGS: Spot magnification views of the left breast demonstrate multiple groups of calcifications spanning an area measuring 5.9 x 4.0 x 3.8 cm in maximum dimensions in the lower outer portion of the left breast. The calcifications vary in size, shape and density. The largest single group measures 1.1 x 0.5 x 0.3 cm in maximum dimensions.  IMPRESSION: Indeterminate  left breast calcifications, suspicious for the possibility of ductal carcinoma in situ. Stereotactic guided core needle biopsy of the 1.1 cm group of calcifications is recommended.  RECOMMENDATION: Left breast stereotactic guided core needle biopsy. This has been scheduled for 02/25/2014 at 10 a.m.  I have discussed the findings and recommendations with the patient. Results were also provided in writing at the conclusion of the visit. If applicable, a reminder letter will be sent to the patient regarding the next appointment.  BI-RADS CATEGORY  4: Suspicious.   Electronically Signed   By: Enrique Sack M.D.   On: 02/16/2014 11:49   Mm Screening Breast Tomo Bilateral  02/11/2014   CLINICAL DATA:  Screening.  EXAM: DIGITAL SCREENING BILATERAL MAMMOGRAM WITH 3D TOMO WITH CAD  COMPARISON:  Previous Exam(s)  ACR Breast Density Category a: The breast tissue is almost entirely fatty.  FINDINGS: In the left breast, calcifications warrant further evaluation with magnified views. In the right breast, no findings suspicious for malignancy. Images were processed with CAD.  IMPRESSION: Further evaluation is suggested for calcifications in the left breast.  RECOMMENDATION: Diagnostic mammogram of the left breast. (Code:[id number])  The patient will be contacted regarding the findings, and additional imaging will be scheduled.  BI-RADS CATEGORY  0: Incomplete. Need additional imaging evaluation  and/or prior mammograms for comparison.   Electronically Signed   By: Hassan Rowan M.D.   On: 02/11/2014 16:45   Mm Lt Breast Bx W Loc Dev 1st Lesion Image Bx Spec Stereo Guide  03/01/2014   ADDENDUM REPORT: 02/28/2014 12:44  ADDENDUM: Pathology revealed ductal carcinoma in situ with calcifications in the left breast. This was found to be concordant by Dr. Curlene Dolphin. Pathology was discussed with the patient and her questions were answered. She reported doing well after the biopsy and was without bruising or tenderness. Post biopsy instructions were reviewed. The patient's calcifications span 5.9 X 4.0 X 3.8 cm, and she is aware that she may need an additional biopsy. She has been scheduled for the [facility] on March 09, 2014 and a bilateral breast MRI on March 04, 2014. She is encouraged to come to the [facility] for educational materials. My number was provided to her for future questions and concerns.  Pathology results reported by Susa Raring RN, BSN on February 28, 2014.   Electronically Signed   By: Curlene Dolphin M.D.   On: 02/28/2014 12:44   03/01/2014   CLINICAL DATA:  Calcifications left breast for stereotactic biopsy is recommended.  EXAM: LEFT BREAST STEREOTACTIC CORE NEEDLE BIOPSY  COMPARISON:  Previous exams.  FINDINGS: The patient and I discussed the procedure of stereotactic-guided biopsy including benefits and alternatives. We discussed the high likelihood of a successful procedure. We discussed the risks of the procedure including infection, bleeding, tissue injury, clip migration, and inadequate sampling. Informed written consent was given. The usual time out protocol was performed immediately prior to the procedure.  Using sterile technique and 2% Lidocaine as local anesthetic, under stereotactic guidance, a 9 gauge vacuum assisted device was used to perform core needle biopsy of calcifications in the lower outer quadrant of the left breast  using an inferior to superior approach. Specimen radiograph was performed showing multiple calcifications within 1 of the cores.  At the conclusion of the procedure, an "X" shaped tissue marker clip was deployed into the biopsy cavity. Follow-up 2-view mammogram confirmed clip placement to be in satisfactory position.  IMPRESSION: Stereotactic-guided biopsy of the  left breast. No apparent complications.  Electronically Signed: By: Curlene Dolphin M.D. 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  Her case has been presented in breast tumor conference today followed by discussed at length in the multidisciplinary breast cancer clinic.Surgical Options have been presented to the patient especially in view of her extensive DCIS was recommended to proceed with mastectomy and left sentinel node mapping. Patient desires to have bilateral mastectomy in view of discomfort and shoulder pain because of large breasts.  I have asked the patient to follow up in a month after surgery to go over the surgical path report and subsequent antiestrogen therapy to follow after review of path report.  The patient voices understanding of current disease status and treatment options and is in agreement with the current care plan.  All questions were answered. The patient knows to call the clinic with any problems, questions or concerns. We can certainly see the patient much sooner if necessary.  Thank you so much for allowing me to participate in the care of [facility]. I will  continue to follow up the patient with you and assist in her care.  I spent  50% of the time counseling the patient face to face. The total time spent in the appointment was 44minutes   [place], [place] [facility]  03/09/2014, 4:18 PM

## 2014-03-09 NOTE — Progress Notes (Signed)
Checked in new pt with no financial concerns at this time.  I informed pt of the [facility] program and gave her a pamphlet on it.

## 2014-03-09 NOTE — Addendum Note (Signed)
Addended by: Luella Cook III on: 03/09/2014 03:13 PM   Modules accepted: Orders

## 2014-03-09 NOTE — Progress Notes (Signed)
Radiation Oncology         ([phone]) [phone] ________________________________  Initial outpatient Consultation  Name: Brooke Hoover MRN: [id number]  Date: 03/09/2014  DOB: 1951/05/06  WU:[id number],NWGNFAO DAVID, MD  Merrie Roof, MD   REFERRING PHYSICIAN: Luella Cook III, MD  DIAGNOSIS: Stage 0 left breast DCIS. ER+  HISTORY OF PRESENT ILLNESS::Brooke Hoover is a 63 y.o. female who went for about 2 years without mammography before her most recent screening mammogram which showed some abnormal calcifications in the upper outer left breast. These measured 5.9 cm. This was biopsied and showed DCIS that was ER and PR +. The MRI showed the suspicious area for extensive DCIS to measure 9.3 cm in greatest dimension. She is otherwise in her USOH.    PREVIOUS RADIATION THERAPY: No  PAST MEDICAL HISTORY:  has a past medical history of Hypertension; Hyperlipidemia; Obesity; Allergy; Depression; Arthritis; Cancer; Vitamin D deficiency; Eczema; Prediabetes; Anxiety; and Cervical cancer.    PAST SURGICAL HISTORY: Past Surgical History  Procedure Laterality Date  . Cholecystectomy    . Abdominal hysterectomy      Partial, due to cervical cancer  . Roux-en-y procedure  1998  . Tubal ligation      FAMILY HISTORY: family history includes COPD in her brother; Cancer in her father and sister; Cancer (age of onset: 37) in her sister; Depression in her brother, mother, sister, sister, and sister; Heart disease in her brother; Hyperlipidemia in her brother, mother, and sister; Hypertension in her brother, mother, and sister; Stroke in her mother.  SOCIAL HISTORY:  reports that she has been passively smoking.  She has never used smokeless tobacco. She reports that she does not drink alcohol or use illicit drugs.  ALLERGIES: Lisinopril  MEDICATIONS:  Current Outpatient Prescriptions  Medication Sig Dispense Refill  . acetaminophen (TYLENOL) 650 MG CR tablet Take 650 mg by mouth every 8 (eight)  hours as needed for pain.      Marland Kitchen atorvastatin (LIPITOR) 40 MG tablet Take 1 tablet (40 mg total) by mouth daily.  90 tablet  0  . BIOTIN PO Take 1 tablet by mouth daily.      . celecoxib (CELEBREX) 200 MG capsule Take 1 capsule (200 mg total) by mouth 2 (two) times daily.  60 capsule  2  . cetirizine (ZYRTEC) 10 MG tablet Take 10 mg by mouth daily.      . Diclofenac (ZORVOLEX) 35 MG CAPS 1 pill three times daily as needed for pain  90 capsule  4  . losartan-hydrochlorothiazide (HYZAAR) 50-12.5 MG per tablet Take 1 tablet by mouth daily.      . Melatonin 1 MG CAPS Take 3 mg by mouth daily.      . Multiple Vitamins-Minerals (MULTIVITAMIN PO) Take 1 tablet by mouth daily.      Marland Kitchen neomycin-polymyxin-hydrocortisone (CORTISPORIN) otic solution Place 3 drops into both ears 4 (four) times daily.      Marland Kitchen nystatin (MYCOSTATIN/NYSTOP) 100000 UNIT/GM POWD Apply daily after shower for yeast  30 g  4  . phentermine (ADIPEX-P) 37.5 MG tablet TAKE ONE TABLET BY MOUTH ONCE DAILY BEFORE BREAKFAST.  30 tablet  2  . sertraline (ZOLOFT) 100 MG tablet Take 300 mg by mouth daily.      . traZODone (DESYREL) 100 MG tablet Take 200 mg by mouth at bedtime.      . Vitamin D, Ergocalciferol, (DRISDOL) 50000 UNITS CAPS capsule Take 50,000 Units by mouth every 30 (thirty) days.  No current facility-administered medications for this encounter.    REVIEW OF SYSTEMS:  Notable for that above.   PHYSICAL EXAM:   Vitals with Age-Percentiles 03/09/2014  Length 703 cm  Systolic 500  Diastolic 82  Pulse 938  Respiration 20  Weight 100.472 kg  BMI 39.3  VISIT REPORT    General: Alert and oriented, in no acute distress HEENT: Head is normocephalic. Pupils are equally round and reactive to light. Extraocular movements are intact. Oropharynx is clear. Neck: Neck is supple, no palpable cervical or supraclavicular lymphadenopathy. Fatty? soft nodule in L SCV region, patient reports it is stable x several years. Does not feel  like a node. Heart: Regular in rate and rhythm with no murmurs, rubs, or gallops. Chest: Clear to auscultation bilaterally, with no rhonchi, wheezes, or rales. Abdomen: Soft, nontender, nondistended, with no rigidity or guarding. Extremities: No cyanosis or edema. Lymphatics: No concerning lymphadenopathy. Skin: Yeast like rash in inframammary folds Musculoskeletal: symmetric strength and muscle tone throughout. Neurologic: Cranial nerves II through XII are grossly intact. No obvious focalities. Speech is fluent. Coordination is intact. Psychiatric: Judgment and insight are intact. Affect is appropriate. Breasts: palpable firmness in UOQ of left breast, consistent with bx changes. Patient and daughter report this developed after bx. Breasts are large. NO other lesions appreciated in breasts or axilla b/l.   ECOG = 0  0 - Asymptomatic (Fully active, able to carry on all predisease activities without restriction)  1 - Symptomatic but completely ambulatory (Restricted in physically strenuous activity but ambulatory and able to carry out work of a light or sedentary nature. For example, light housework, office work)  2 - Symptomatic, <50% in bed during the day (Ambulatory and capable of all self care but unable to carry out any work activities. Up and about more than 50% of waking hours)  3 - Symptomatic, >50% in bed, but not bedbound (Capable of only limited self-care, confined to bed or chair 50% or more of waking hours)  4 - Bedbound (Completely disabled. Cannot carry on any self-care. Totally confined to bed or chair)  5 - Death   Eustace Pen MM, Creech RH, Tormey DC, et al. [phone]). "Toxicity and response criteria of the [facility]". Ray Oncol. 5 (6): 649-55   LABORATORY DATA:  Lab Results  Component Value Date   WBC 8.4 03/09/2014   HGB 13.3 03/09/2014   HCT 40.1 03/09/2014   MCV 88.5 03/09/2014   PLT 335 03/09/2014   CMP     Component Value Date/Time    NA 139 03/09/2014 1210   NA 140 02/04/2014 1126   K 4.1 03/09/2014 1210   K 4.4 02/04/2014 1126   CL 101 02/04/2014 1126   CO2 30* 03/09/2014 1210   CO2 29 02/04/2014 1126   GLUCOSE 120 03/09/2014 1210   GLUCOSE 117* 02/04/2014 1126   BUN 9.9 03/09/2014 1210   BUN 12 02/04/2014 1126   CREATININE 0.8 03/09/2014 1210   CREATININE 0.59 02/04/2014 1126   CALCIUM 10.2 03/09/2014 1210   CALCIUM 9.8 02/04/2014 1126   PROT 7.5 03/09/2014 1210   PROT 6.9 02/04/2014 1126   ALBUMIN 4.2 03/09/2014 1210   ALBUMIN 4.2 02/04/2014 1126   AST 30 03/09/2014 1210   AST 24 02/04/2014 1126   ALT 28 03/09/2014 1210   ALT 24 02/04/2014 1126   ALKPHOS 92 03/09/2014 1210   ALKPHOS 80 02/04/2014 1126   BILITOT 0.41 03/09/2014 1210   BILITOT 0.4 02/04/2014 1126  [facility] >89 02/04/2014 1126   GFRAA >89 02/04/2014 1126         RADIOGRAPHY: Mr Breast Bilateral W Wo Contrast  03/04/2014   CLINICAL DATA:  Patient is post malignant stereotactic left breast biopsy for microcalcifications demonstrating DCIS.  LABS:  BUN/creatinine 12/0.59 with GFR greater than 89 performed at [facility] 02/04/14.  EXAM: BILATERAL BREAST MRI WITH AND WITHOUT CONTRAST  TECHNIQUE: Multiplanar, multisequence MR images of both breasts were obtained prior to and following the intravenous administration of 90ml of MultiHance.  THREE-DIMENSIONAL MR IMAGE RENDERING ON INDEPENDENT WORKSTATION:  Three-dimensional MR images were rendered by post-processing of the original MR data on an independent workstation. The three-dimensional MR images were interpreted, and findings are reported in the following complete MRI report for this study. Three dimensional images were evaluated at the independent DynaCad workstation  COMPARISON:  Previous exams  FINDINGS: Breast composition: b.  Scattered fibroglandular tissue.  Background parenchymal enhancement: Mild.  Right breast: No suspicious mass or abnormal enhancement.  Left breast: Clip artifact is present over the slightly outer  mid portion of the left breast from patient's recent biopsy. Clip artifact is also present over the inner mid portion of the breast from previous benign biopsy. A 1.2 cm hematoma is present at the recent biopsy site over the slightly outer mid portion of the left breast at approximately the 3 o'clock position.  There is a region of clumped non mass enhancement over the left breast predominately around the 3 o'clock position extending towards the upper outer quadrant to the approximate 12 o'clock position and covering an area measuring approximately 4.5 x 9.3 x 4.8 cm in its transverse, AP and craniocaudal dimensions. Patient's recent malignant biopsy site lies centrally within this clumped non mass enhancement. This enhancement begins approximately 2.7 cm posterior to the nipple.  Lymph nodes: No abnormal appearing lymph nodes.  Ancillary findings:  None.  IMPRESSION: Region of clumped non mass enhancement over the central left breast from the 3 o'clock towards the 12 o'clock position measuring approximately 4.5 x 9.3 x 4.8 cm in its transverse, AP and craniocaudal dimensions corresponding to patient's biopsy proven DCIS with clip artifact located centrally within this abnormal enhancement. This MR enhancement appears slightly more extensive than the calcifications visualized mammographically.  RECOMMENDATION: If considering breast conservation surgery, MRI guided biopsy could be performed along the posterior extent of this enhancement to document extent of disease. Otherwise, recommend continued follow-up as per clinical treatment plan.  BI-RADS CATEGORY  6: Known biopsy-proven malignancy.   Electronically Signed   By: Marin Olp M.D.   On: 03/04/2014 16:42   Mm Digital Diagnostic Unilat L  02/16/2014   CLINICAL DATA:  Left breast calcifications at recent screening mammography.  EXAM: DIGITAL DIAGNOSTIC  LEFT MAMMOGRAM  COMPARISON:  Previous examinations, including the screening mammogram dated 02/08/2014.   ACR Breast Density Category b: There are scattered areas of fibroglandular density.  FINDINGS: Spot magnification views of the left breast demonstrate multiple groups of calcifications spanning an area measuring 5.9 x 4.0 x 3.8 cm in maximum dimensions in the lower outer portion of the left breast. The calcifications vary in size, shape and density. The largest single group measures 1.1 x 0.5 x 0.3 cm in maximum dimensions.  IMPRESSION: Indeterminate left breast calcifications, suspicious for the possibility of ductal carcinoma in situ. Stereotactic guided core needle biopsy of the 1.1 cm group of calcifications is recommended.  RECOMMENDATION: Left breast stereotactic guided core needle biopsy. This has been scheduled  for 02/25/2014 at 10 a.m.  I have discussed the findings and recommendations with the patient. Results were also provided in writing at the conclusion of the visit. If applicable, a reminder letter will be sent to the patient regarding the next appointment.  BI-RADS CATEGORY  4: Suspicious.   Electronically Signed   By: Enrique Sack M.D.   On: 02/16/2014 11:49   Mm Screening Breast Tomo Bilateral  02/11/2014   CLINICAL DATA:  Screening.  EXAM: DIGITAL SCREENING BILATERAL MAMMOGRAM WITH 3D TOMO WITH CAD  COMPARISON:  Previous Exam(s)  ACR Breast Density Category a: The breast tissue is almost entirely fatty.  FINDINGS: In the left breast, calcifications warrant further evaluation with magnified views. In the right breast, no findings suspicious for malignancy. Images were processed with CAD.  IMPRESSION: Further evaluation is suggested for calcifications in the left breast.  RECOMMENDATION: Diagnostic mammogram of the left breast. (Code:FI-L-34M)  The patient will be contacted regarding the findings, and additional imaging will be scheduled.  BI-RADS CATEGORY  0: Incomplete. Need additional imaging evaluation and/or prior mammograms for comparison.   Electronically Signed   By: Hassan Rowan M.D.   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 This was found to be concordant by Dr. Curlene Dolphin. Pathology was discussed with the patient and her questions were answered. She reported doing well after the biopsy and was without bruising or tenderness. Post biopsy instructions were reviewed. The patient's calcifications span 5.9 X 4.0 X 3.8 cm, and she is aware that she may need an additional biopsy. She has been scheduled for the [facility] on March 09, 2014 and a bilateral breast MRI on March 04, 2014. She is encouraged to come to the [facility] for educational materials. My number was provided to her for future questions and concerns.  Pathology results reported by Susa Raring RN, BSN on February 28, 2014.   Electronically Signed   By: Curlene Dolphin M.D.   On: 02/28/2014 12:44   03/01/2014   CLINICAL DATA:  Calcifications left breast for stereotactic biopsy is recommended.  EXAM: LEFT BREAST STEREOTACTIC CORE NEEDLE BIOPSY  COMPARISON:  Previous exams.  FINDINGS: The patient and I discussed the procedure of stereotactic-guided biopsy including benefits and alternatives. We discussed the high likelihood of a successful procedure. We discussed the risks of the procedure including infection, bleeding, tissue injury, clip migration, and inadequate sampling. Informed written consent was given. The usual time out protocol was performed immediately prior to the procedure.  Using sterile technique and 2% Lidocaine as local anesthetic, under stereotactic guidance, a 9 gauge vacuum assisted device was used to perform core needle biopsy of calcifications in the lower outer quadrant of the left breast using an inferior to superior approach. Specimen radiograph was performed showing  multiple calcifications within 1 of the cores.  At the conclusion of the procedure, an "X" shaped tissue marker clip was deployed into the biopsy cavity. Follow-up 2-view mammogram confirmed clip placement to be in satisfactory position.  IMPRESSION: Stereotactic-guided biopsy of the left breast. No apparent complications.  Electronically Signed: By: Curlene Dolphin M.D. On: 02/25/2014 13:40      IMPRESSION/PLAN: Lovely 63 yo woman with DCIS. Per tumor board discussion, mastectomy has been recommended. It is unlikely  she will need adjuvant radiotherapy. However, if her stage changes significantly (I.E. positive node(s), large invasive tumor, positive margins) I would be happy to see her back for re-assessment.  I wished her the very best.  She has cream for candidiasis and will apply this to her inframammary folds TID. __________________________________________   Eppie Gibson, MD

## 2014-03-10 ENCOUNTER — Encounter: Payer: Self-pay | Admitting: Radiation Oncology

## 2014-03-10 ENCOUNTER — Telehealth: Payer: Self-pay | Admitting: Hematology and Oncology

## 2014-03-10 NOTE — Telephone Encounter (Signed)
, °

## 2014-03-11 ENCOUNTER — Encounter: Payer: Self-pay | Admitting: Specialist

## 2014-03-11 NOTE — Progress Notes (Signed)
Patient said she had been an "8" on the distress scale but after the breast clinic, she was feeling less anxious because of the information she received. She has a daughter who lives in [place]. She uses humor to help her cope with hard places in life. Support.

## 2014-03-16 ENCOUNTER — Telehealth: Payer: Self-pay | Admitting: *Deleted

## 2014-03-16 NOTE — Telephone Encounter (Signed)
Called pt to f/u from [facility] on 03/09/14. Pt has vm that has not been set up. Will call again later.

## 2014-03-22 ENCOUNTER — Encounter (HOSPITAL_COMMUNITY): Payer: Self-pay | Admitting: Pharmacy Technician

## 2014-03-22 NOTE — Pre-Procedure Instructions (Addendum)
Brooke Hoover  03/22/2014   Your procedure is scheduled on: Thursday, March 31, 2014  Report to [facility] Entrance "A" [address] at [employer] AM.  Call this number if you have problems the morning of surgery: [phone]   Remember:   Do not eat food or drink liquids after midnight.     Take these medicines the morning of surgery with A SIP OF WATER: ceftirizine (Zyrtec), sertraline (Zoloft) if you normally take it in the morning   STOP Multiple Vitamins, Phentermine, Biotin, Melatonin, today   STOP/ Do not take Aspirin, Aleve, Naproxen, Advil, Ibuprofen, Motrin, Vitamins, Herbs, or Supplements starting today   Do not wear jewelry, make-up or nail polish.  Do not wear lotions, powders, or perfumes. You may wear deodorant.  Do not shave 48 hours prior to surgery.  Do not bring valuables to the hospital.  [facility] is not responsible for any belongings or valuables.               Contacts, dentures or bridgework may not be worn into surgery.  Leave suitcase in the car. After surgery it may be brought to your room.  For patients admitted to the hospital, discharge time is determined by your                treatment team.               Patients discharged the day of surgery will not be allowed to drive  home.   Special Instructions: Please use CHG soap the night before surgery and the day of surgery. CHG soap should be used atleast twice.   Please read over the following fact sheets that you were given: Pain Booklet, Coughing and Deep Breathing and Surgical Site Infection Prevention

## 2014-03-23 ENCOUNTER — Encounter (HOSPITAL_COMMUNITY)
Admission: RE | Admit: 2014-03-23 | Discharge: 2014-03-23 | Disposition: A | Discharge status: Home / Self Care | Payer: BC Managed Care – PPO | Source: Ambulatory Visit | Attending: General Surgery | Admitting: General Surgery

## 2014-03-23 ENCOUNTER — Encounter (HOSPITAL_COMMUNITY): Payer: Self-pay

## 2014-03-23 ENCOUNTER — Encounter (HOSPITAL_COMMUNITY)
Admission: RE | Admit: 2014-03-23 | Discharge: 2014-03-23 | Disposition: A | Discharge status: Home / Self Care | Payer: BC Managed Care – PPO | Source: Ambulatory Visit | Attending: Anesthesiology | Admitting: Anesthesiology

## 2014-03-23 DIAGNOSIS — Z01818 Encounter for other preprocedural examination: Secondary | ICD-10-CM | POA: Insufficient documentation

## 2014-03-23 DIAGNOSIS — Z01812 Encounter for preprocedural laboratory examination: Secondary | ICD-10-CM | POA: Insufficient documentation

## 2014-03-23 HISTORY — DX: Malignant neoplasm of unspecified site of unspecified female breast: C50.919

## 2014-03-23 LAB — CBC
HEMATOCRIT: 38.8 % (ref 36.0–46.0)
HEMOGLOBIN: 12.6 g/dL (ref 12.0–15.0)
MCH: 29.6 pg (ref 26.0–34.0)
MCHC: 32.5 g/dL (ref 30.0–36.0)
MCV: 91.3 fL (ref 78.0–100.0)
Platelets: 300 10*3/uL (ref 150–400)
RBC: 4.25 MIL/uL (ref 3.87–5.11)
RDW: 14.2 % (ref 11.5–15.5)
WBC: 6.4 10*3/uL (ref 4.0–10.5)

## 2014-03-23 LAB — BASIC METABOLIC PANEL
ANION GAP: 14 (ref 5–15)
BUN: 9 mg/dL (ref 6–23)
CALCIUM: 9.4 mg/dL (ref 8.4–10.5)
CHLORIDE: 97 meq/L (ref 96–112)
CO2: 29 meq/L (ref 19–32)
CREATININE: 0.58 mg/dL (ref 0.50–1.10)
GFR calc Af Amer: >90 mL/min (ref >90)
GFR calc non Af Amer: >90 mL/min (ref >90)
GLUCOSE: 138 mg/dL — AB (ref 70–99)
Potassium: 4 mEq/L (ref 3.7–5.3)
Sodium: 140 mEq/L (ref 137–147)

## 2014-03-30 MED ORDER — CHLORHEXIDINE GLUCONATE 4 % EX LIQD
1.0000 "application " | Freq: Once | CUTANEOUS | Status: DC
  Filled 2014-03-30: qty 15

## 2014-03-30 MED ORDER — CHLORHEXIDINE GLUCONATE 4 % EX LIQD
1.0000 | Freq: Once | CUTANEOUS | Status: DC
Start: 2014-03-31 — End: 2014-03-31
  Filled 2014-03-30: qty 15

## 2014-03-30 MED ORDER — CEFAZOLIN SODIUM-DEXTROSE 2-3 GM-% IV SOLR
2.0000 g | INTRAVENOUS | Status: AC
  Administered 2014-03-31: 2 g via INTRAVENOUS
  Filled 2014-03-30 (×2): qty 50

## 2014-03-31 ENCOUNTER — Encounter (HOSPITAL_COMMUNITY)
Admission: RE | Disposition: A | Discharge status: Home / Self Care | Payer: Self-pay | Source: Ambulatory Visit | Attending: General Surgery

## 2014-03-31 ENCOUNTER — Encounter (HOSPITAL_COMMUNITY): Payer: Self-pay | Admitting: Surgery

## 2014-03-31 ENCOUNTER — Ambulatory Visit (HOSPITAL_COMMUNITY)
Admission: RE | Admit: 2014-03-31 | Discharge: 2014-04-01 | Disposition: A | Discharge status: Home / Self Care | Payer: BC Managed Care – PPO | Source: Ambulatory Visit | Attending: General Surgery | Admitting: General Surgery

## 2014-03-31 ENCOUNTER — Ambulatory Visit (HOSPITAL_COMMUNITY): Payer: BC Managed Care – PPO | Admitting: Anesthesiology

## 2014-03-31 ENCOUNTER — Encounter (HOSPITAL_COMMUNITY)
Admission: RE | Admit: 2014-03-31 | Discharge: 2014-03-31 | Disposition: A | Discharge status: Home / Self Care | Payer: BC Managed Care – PPO | Source: Ambulatory Visit | Attending: General Surgery | Admitting: General Surgery

## 2014-03-31 ENCOUNTER — Encounter (HOSPITAL_COMMUNITY): Payer: BC Managed Care – PPO | Admitting: Anesthesiology

## 2014-03-31 DIAGNOSIS — C50412 Malignant neoplasm of upper-outer quadrant of left female breast: Secondary | ICD-10-CM

## 2014-03-31 DIAGNOSIS — Z9079 Acquired absence of other genital organ(s): Secondary | ICD-10-CM | POA: Insufficient documentation

## 2014-03-31 DIAGNOSIS — Z4001 Encounter for prophylactic removal of breast: Secondary | ICD-10-CM

## 2014-03-31 DIAGNOSIS — C50419 Malignant neoplasm of upper-outer quadrant of unspecified female breast: Secondary | ICD-10-CM | POA: Insufficient documentation

## 2014-03-31 DIAGNOSIS — Z6841 Body Mass Index (BMI) 40.0 and over, adult: Secondary | ICD-10-CM | POA: Insufficient documentation

## 2014-03-31 DIAGNOSIS — F411 Generalized anxiety disorder: Secondary | ICD-10-CM | POA: Insufficient documentation

## 2014-03-31 DIAGNOSIS — E669 Obesity, unspecified: Secondary | ICD-10-CM | POA: Insufficient documentation

## 2014-03-31 DIAGNOSIS — E785 Hyperlipidemia, unspecified: Secondary | ICD-10-CM | POA: Insufficient documentation

## 2014-03-31 DIAGNOSIS — D0590 Unspecified type of carcinoma in situ of unspecified breast: Secondary | ICD-10-CM | POA: Insufficient documentation

## 2014-03-31 DIAGNOSIS — I10 Essential (primary) hypertension: Secondary | ICD-10-CM | POA: Insufficient documentation

## 2014-03-31 DIAGNOSIS — D0512 Intraductal carcinoma in situ of left breast: Secondary | ICD-10-CM

## 2014-03-31 DIAGNOSIS — F329 Major depressive disorder, single episode, unspecified: Secondary | ICD-10-CM | POA: Insufficient documentation

## 2014-03-31 DIAGNOSIS — Z8541 Personal history of malignant neoplasm of cervix uteri: Secondary | ICD-10-CM | POA: Insufficient documentation

## 2014-03-31 DIAGNOSIS — Z853 Personal history of malignant neoplasm of breast: Secondary | ICD-10-CM

## 2014-03-31 DIAGNOSIS — F3289 Other specified depressive episodes: Secondary | ICD-10-CM | POA: Insufficient documentation

## 2014-03-31 HISTORY — PX: COMPLETE MASTECTOMY W/ SENTINEL NODE BIOPSY: SHX1383

## 2014-03-31 HISTORY — PX: MASTECTOMY W/ SENTINEL NODE BIOPSY: SHX2001

## 2014-03-31 SURGERY — MASTECTOMY WITH SENTINEL LYMPH NODE BIOPSY
Anesthesia: General | Site: Breast | Laterality: Bilateral

## 2014-03-31 MED ORDER — FENTANYL CITRATE 0.05 MG/ML IJ SOLN
INTRAMUSCULAR | Status: DC | PRN
  Administered 2014-03-31: 100 ug via INTRAVENOUS
  Administered 2014-03-31 (×2): 50 ug via INTRAVENOUS
  Administered 2014-03-31: 25 ug via INTRAVENOUS
  Administered 2014-03-31 (×4): 50 ug via INTRAVENOUS
  Administered 2014-03-31: 75 ug via INTRAVENOUS

## 2014-03-31 MED ORDER — OXYCODONE-ACETAMINOPHEN 5-325 MG PO TABS
1.0000 | ORAL_TABLET | ORAL | Status: DC | PRN
  Administered 2014-03-31: 1 via ORAL
  Filled 2014-03-31: qty 2

## 2014-03-31 MED ORDER — LACTATED RINGERS IV SOLN
INTRAVENOUS | Status: DC | PRN
  Administered 2014-03-31 (×2): via INTRAVENOUS

## 2014-03-31 MED ORDER — DEXAMETHASONE SODIUM PHOSPHATE 4 MG/ML IJ SOLN
INTRAMUSCULAR | Status: AC
  Filled 2014-03-31: qty 2

## 2014-03-31 MED ORDER — HYDRALAZINE HCL 20 MG/ML IJ SOLN
INTRAMUSCULAR | Status: DC | PRN
  Administered 2014-03-31 (×4): 5 mg via INTRAVENOUS

## 2014-03-31 MED ORDER — PROPRANOLOL HCL 1 MG/ML IV SOLN
0.3000 mg | INTRAVENOUS | Status: DC | PRN
  Administered 2014-03-31 (×3): 0.3 mg via INTRAVENOUS

## 2014-03-31 MED ORDER — 0.9 % SODIUM CHLORIDE (POUR BTL) OPTIME
TOPICAL | Status: DC | PRN
  Administered 2014-03-31 (×2): 1000 mL

## 2014-03-31 MED ORDER — FENTANYL CITRATE 0.05 MG/ML IJ SOLN: 50.0000 ug | INTRAMUSCULAR | Status: DC | PRN

## 2014-03-31 MED ORDER — FENTANYL CITRATE 0.05 MG/ML IJ SOLN
INTRAMUSCULAR | Status: AC
  Filled 2014-03-31: qty 5

## 2014-03-31 MED ORDER — PROPOFOL 10 MG/ML IV BOLUS
INTRAVENOUS | Status: DC | PRN
  Administered 2014-03-31: 200 mg via INTRAVENOUS

## 2014-03-31 MED ORDER — SERTRALINE HCL 100 MG PO TABS
200.0000 mg | ORAL_TABLET | Freq: Every day | ORAL | Status: DC
  Filled 2014-03-31: qty 2

## 2014-03-31 MED ORDER — PROPRANOLOL HCL 1 MG/ML IV SOLN: 0.3000 mg | INTRAVENOUS | Status: DC

## 2014-03-31 MED ORDER — ONDANSETRON HCL 4 MG/2ML IJ SOLN: 4.0000 mg | Freq: Four times a day (QID) | INTRAMUSCULAR | Status: DC | PRN

## 2014-03-31 MED ORDER — METOCLOPRAMIDE HCL 5 MG/ML IJ SOLN
INTRAMUSCULAR | Status: AC
  Filled 2014-03-31: qty 2

## 2014-03-31 MED ORDER — ONDANSETRON HCL 4 MG/2ML IJ SOLN
INTRAMUSCULAR | Status: DC | PRN
  Administered 2014-03-31: 4 mg via INTRAVENOUS

## 2014-03-31 MED ORDER — ONDANSETRON HCL 4 MG PO TABS: 4.0000 mg | ORAL_TABLET | Freq: Four times a day (QID) | ORAL | Status: DC | PRN

## 2014-03-31 MED ORDER — PROPOFOL 10 MG/ML IV BOLUS
INTRAVENOUS | Status: AC
  Filled 2014-03-31: qty 20

## 2014-03-31 MED ORDER — ROCURONIUM BROMIDE 50 MG/5ML IV SOLN
INTRAVENOUS | Status: AC
  Filled 2014-03-31: qty 1

## 2014-03-31 MED ORDER — ONDANSETRON HCL 4 MG/2ML IJ SOLN
INTRAMUSCULAR | Status: AC
  Filled 2014-03-31: qty 2

## 2014-03-31 MED ORDER — DEXAMETHASONE SODIUM PHOSPHATE 4 MG/ML IJ SOLN
INTRAMUSCULAR | Status: DC | PRN
  Administered 2014-03-31: 8 mg via INTRAVENOUS

## 2014-03-31 MED ORDER — SCOPOLAMINE 1 MG/3DAYS TD PT72
MEDICATED_PATCH | TRANSDERMAL | Status: DC | PRN
  Administered 2014-03-31: 1 via TRANSDERMAL

## 2014-03-31 MED ORDER — SODIUM CHLORIDE 0.9 % IJ SOLN
INTRAMUSCULAR | Status: AC
  Filled 2014-03-31: qty 3

## 2014-03-31 MED ORDER — ARTIFICIAL TEARS OP OINT
TOPICAL_OINTMENT | OPHTHALMIC | Status: AC
  Filled 2014-03-31: qty 3.5

## 2014-03-31 MED ORDER — BUPIVACAINE-EPINEPHRINE (PF) 0.5% -1:200000 IJ SOLN
INTRAMUSCULAR | Status: DC | PRN
  Administered 2014-03-31: 30 mL via PERINEURAL

## 2014-03-31 MED ORDER — NEOSTIGMINE METHYLSULFATE 10 MG/10ML IV SOLN
INTRAVENOUS | Status: AC
  Filled 2014-03-31: qty 1

## 2014-03-31 MED ORDER — PROMETHAZINE HCL 25 MG/ML IJ SOLN: 6.2500 mg | INTRAMUSCULAR | Status: DC | PRN

## 2014-03-31 MED ORDER — LOSARTAN POTASSIUM 50 MG PO TABS
50.0000 mg | ORAL_TABLET | Freq: Every day | ORAL | Status: DC
  Administered 2014-03-31: 50 mg via ORAL
  Filled 2014-03-31 (×2): qty 1

## 2014-03-31 MED ORDER — LIDOCAINE HCL (CARDIAC) 20 MG/ML IV SOLN
INTRAVENOUS | Status: AC
  Filled 2014-03-31: qty 5

## 2014-03-31 MED ORDER — PROPRANOLOL HCL 1 MG/ML IV SOLN
INTRAVENOUS | Status: AC
  Filled 2014-03-31: qty 1

## 2014-03-31 MED ORDER — HYDROMORPHONE HCL PF 1 MG/ML IJ SOLN: 0.2500 mg | INTRAMUSCULAR | Status: DC | PRN

## 2014-03-31 MED ORDER — TRAZODONE HCL 100 MG PO TABS
200.0000 mg | ORAL_TABLET | Freq: Every day | ORAL | Status: DC
  Administered 2014-03-31: 200 mg via ORAL
  Filled 2014-03-31 (×2): qty 2

## 2014-03-31 MED ORDER — SCOPOLAMINE 1 MG/3DAYS TD PT72
MEDICATED_PATCH | TRANSDERMAL | Status: AC
  Filled 2014-03-31: qty 1

## 2014-03-31 MED ORDER — HYDRALAZINE HCL 20 MG/ML IJ SOLN
INTRAMUSCULAR | Status: AC
  Filled 2014-03-31: qty 1

## 2014-03-31 MED ORDER — METHYLENE BLUE 1 % INJ SOLN
INTRAMUSCULAR | Status: AC
  Filled 2014-03-31: qty 10

## 2014-03-31 MED ORDER — GLYCOPYRROLATE 0.2 MG/ML IJ SOLN
INTRAMUSCULAR | Status: AC
  Filled 2014-03-31: qty 2

## 2014-03-31 MED ORDER — KCL IN DEXTROSE-NACL 20-5-0.9 MEQ/L-%-% IV SOLN
INTRAVENOUS | Status: DC
  Administered 2014-03-31 – 2014-04-01 (×2): via INTRAVENOUS
  Filled 2014-03-31 (×3): qty 1000

## 2014-03-31 MED ORDER — MIDAZOLAM HCL 2 MG/2ML IJ SOLN
INTRAMUSCULAR | Status: AC
  Filled 2014-03-31: qty 2

## 2014-03-31 MED ORDER — LOSARTAN POTASSIUM-HCTZ 50-12.5 MG PO TABS: 1.0000 | ORAL_TABLET | Freq: Every day | ORAL | Status: DC

## 2014-03-31 MED ORDER — METOCLOPRAMIDE HCL 5 MG/ML IJ SOLN
INTRAMUSCULAR | Status: DC | PRN
  Administered 2014-03-31: 10 mg via INTRAVENOUS

## 2014-03-31 MED ORDER — HYDROCHLOROTHIAZIDE 12.5 MG PO CAPS
12.5000 mg | ORAL_CAPSULE | Freq: Every day | ORAL | Status: DC
  Administered 2014-03-31: 12.5 mg via ORAL
  Filled 2014-03-31 (×2): qty 1

## 2014-03-31 MED ORDER — SUCCINYLCHOLINE CHLORIDE 20 MG/ML IJ SOLN
INTRAMUSCULAR | Status: AC
  Filled 2014-03-31: qty 1

## 2014-03-31 MED ORDER — TECHNETIUM TC 99M SULFUR COLLOID FILTERED
1.0000 | Freq: Once | INTRAVENOUS | Status: AC | PRN
  Administered 2014-03-31: 1 via INTRADERMAL

## 2014-03-31 MED ORDER — HEPARIN SODIUM (PORCINE) 5000 UNIT/ML IJ SOLN
5000.0000 [IU] | Freq: Three times a day (TID) | INTRAMUSCULAR | Status: DC
  Administered 2014-04-01: 5000 [IU] via SUBCUTANEOUS
  Filled 2014-03-31 (×3): qty 1

## 2014-03-31 MED ORDER — MIDAZOLAM HCL 5 MG/5ML IJ SOLN
INTRAMUSCULAR | Status: DC | PRN
  Administered 2014-03-31: 2 mg via INTRAVENOUS

## 2014-03-31 SURGICAL SUPPLY — 56 items
APPLIER CLIP 9.375 MED OPEN (MISCELLANEOUS) ×6
BINDER BREAST LRG (GAUZE/BANDAGES/DRESSINGS) IMPLANT
BINDER BREAST XLRG (GAUZE/BANDAGES/DRESSINGS) ×2 IMPLANT
CANISTER SUCTION 2500CC (MISCELLANEOUS) ×4 IMPLANT
CHLORAPREP W/TINT 26ML (MISCELLANEOUS) ×2 IMPLANT
CLIP APPLIE 9.375 MED OPEN (MISCELLANEOUS) ×3 IMPLANT
CONT SPEC 4OZ CLIKSEAL STRL BL (MISCELLANEOUS) ×4 IMPLANT
COVER PROBE W GEL 5X96 (DRAPES) ×2 IMPLANT
COVER SURGICAL LIGHT HANDLE (MISCELLANEOUS) ×2 IMPLANT
DERMABOND ADVANCED (GAUZE/BANDAGES/DRESSINGS) ×4
DERMABOND ADVANCED .7 DNX12 (GAUZE/BANDAGES/DRESSINGS) ×4 IMPLANT
DEVICE DISSECT PLASMABLAD 3.0S (MISCELLANEOUS) ×1 IMPLANT
DRAIN CHANNEL 19F RND (DRAIN) ×8 IMPLANT
DRAPE LAPAROSCOPIC ABDOMINAL (DRAPES) ×2 IMPLANT
DRAPE UTILITY 15X26 W/TAPE STR (DRAPE) ×4 IMPLANT
DRSG PAD ABDOMINAL 8X10 ST (GAUZE/BANDAGES/DRESSINGS) ×4 IMPLANT
ELECT CAUTERY BLADE 6.4 (BLADE) ×2 IMPLANT
ELECT REM PT RETURN 9FT ADLT (ELECTROSURGICAL) ×4
ELECTRODE REM PT RTRN 9FT ADLT (ELECTROSURGICAL) ×2 IMPLANT
EVACUATOR SILICONE 100CC (DRAIN) ×8 IMPLANT
GAUZE XEROFORM 5X9 LF (GAUZE/BANDAGES/DRESSINGS) IMPLANT
GLOVE BIO SURGEON STRL SZ 6.5 (GLOVE) ×2 IMPLANT
GLOVE BIO SURGEON STRL SZ7.5 (GLOVE) ×2 IMPLANT
GLOVE BIOGEL PI IND STRL 6.5 (GLOVE) ×1 IMPLANT
GLOVE BIOGEL PI IND STRL 7.0 (GLOVE) ×2 IMPLANT
GLOVE BIOGEL PI IND STRL 7.5 (GLOVE) ×2 IMPLANT
GLOVE BIOGEL PI INDICATOR 6.5 (GLOVE) ×1
GLOVE BIOGEL PI INDICATOR 7.0 (GLOVE) ×2
GLOVE BIOGEL PI INDICATOR 7.5 (GLOVE) ×2
GLOVE SURG SS PI 7.0 STRL IVOR (GLOVE) ×6 IMPLANT
GOWN STRL REUS W/ TWL LRG LVL3 (GOWN DISPOSABLE) ×3 IMPLANT
GOWN STRL REUS W/ TWL XL LVL3 (GOWN DISPOSABLE) ×2 IMPLANT
GOWN STRL REUS W/TWL LRG LVL3 (GOWN DISPOSABLE) ×3
GOWN STRL REUS W/TWL XL LVL3 (GOWN DISPOSABLE) ×2
KIT BASIN OR (CUSTOM PROCEDURE TRAY) ×2 IMPLANT
KIT ROOM TURNOVER OR (KITS) ×2 IMPLANT
NEEDLE 18GX1X1/2 (RX/OR ONLY) (NEEDLE) ×2 IMPLANT
NEEDLE HYPO 25GX1X1/2 BEV (NEEDLE) ×2 IMPLANT
NS IRRIG 1000ML POUR BTL (IV SOLUTION) ×2 IMPLANT
PACK GENERAL/GYN (CUSTOM PROCEDURE TRAY) ×2 IMPLANT
PAD ARMBOARD 7.5X6 YLW CONV (MISCELLANEOUS) ×2 IMPLANT
PLASMABLADE 3.0S (MISCELLANEOUS) ×2
SPECIMEN JAR X LARGE (MISCELLANEOUS) ×4 IMPLANT
SPONGE GAUZE 4X4 12PLY (GAUZE/BANDAGES/DRESSINGS) ×2 IMPLANT
SPONGE LAP 18X18 X RAY DECT (DISPOSABLE) ×2 IMPLANT
SUT ETHILON 3 0 FSL (SUTURE) ×8 IMPLANT
SUT MNCRL AB 4-0 PS2 18 (SUTURE) ×4 IMPLANT
SUT MON AB 4-0 PC3 18 (SUTURE) ×4 IMPLANT
SUT VIC AB 3-0 54X BRD REEL (SUTURE) IMPLANT
SUT VIC AB 3-0 BRD 54 (SUTURE)
SUT VIC AB 3-0 SH 18 (SUTURE) ×8 IMPLANT
SYR CONTROL 10ML LL (SYRINGE) ×2 IMPLANT
TOWEL OR 17X24 6PK STRL BLUE (TOWEL DISPOSABLE) IMPLANT
TOWEL OR 17X26 10 PK STRL BLUE (TOWEL DISPOSABLE) ×2 IMPLANT
TUBE CONNECTING 12X1/4 (SUCTIONS) ×2 IMPLANT
YANKAUER SUCT BULB TIP NO VENT (SUCTIONS) ×2 IMPLANT

## 2014-03-31 NOTE — Anesthesia Procedure Notes (Signed)
Anesthesia Regional Block:  Pectoralis block  Pre-Anesthetic Checklist: ,, timeout performed, Correct Patient, Correct Site, Correct Laterality, Correct Procedure, Correct Position, site marked, Risks and benefits discussed,  Surgical consent,  Pre-op evaluation,  At surgeon's request and post-op pain management  Laterality: Left  Prep: chloraprep       Needles:  Injection technique: Single-shot  Needle Type: Echogenic Stimulator Needle     Needle Length: 9cm 9 cm Needle Gauge: 21 and 21 G    Additional Needles:  Procedures: ultrasound guided (picture in chart) Pectoralis block Narrative:  Start time: 03/31/2014 7:09 AM End time: 03/31/2014 7:19 AM Injection made incrementally with aspirations every 5 mL.

## 2014-03-31 NOTE — H&P (View-Only) (Signed)
Patient ID: Brooke Hoover, female   DOB: 1951/04/13, 63 y.o.   MRN: [id number]  No chief complaint on file.   HPI Brooke Hoover is a 63 y.o. female.  We are asked to see the patient in consultation by Dr. 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 The patient is a 63yo wf who presents after screening mammogram showed some abnormal calcifications in the upper outer left breast. This measured 5.9cm. 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 The MRI showed the area to measure 9.3cm. She denies any breast pain or discharge from her nipple.  She does complain of pain in her shoulders because of the weight of her breasts. HPI  Past Medical History  Diagnosis Date  . Hypertension   . Hyperlipidemia   . Obesity   . Allergy   . Depression   . Arthritis   . Cancer     Cervical   . Vitamin D deficiency   . Eczema   . Prediabetes   . Anxiety   . Cervical cancer     Past Surgical History  Procedure Laterality Date  . Cholecystectomy    . Abdominal hysterectomy      Partial, due to cervical cancer  . Roux-en-y procedure  1998  . Tubal ligation      Family History  Problem Relation Age of Onset  . Stroke Mother   . Hyperlipidemia Mother   . Hypertension Mother   . Depression Mother   . Cancer Father     Melanoma  . Cancer Sister     bone   . Hypertension Sister   . Hyperlipidemia Sister   . Depression Sister   . Depression Brother   . Heart disease Brother   . Hyperlipidemia Brother   . Hypertension Brother   . COPD Brother   . Cancer Sister 12    breast cancer double masectomy  . Depression Sister   . Depression Sister     Social History History  Substance Use Topics  . Smoking status: Passive Smoke Exposure - Never Smoker  . Smokeless tobacco: Never Used  . Alcohol Use: No    Allergies  Allergen Reactions  . Lisinopril Diarrhea    Pt. Stated, "ACE inhibitors give me diarrhea"    Current Outpatient Prescriptions  Medication Sig Dispense  Refill  . acetaminophen (TYLENOL) 650 MG CR tablet Take 650 mg by mouth every 8 (eight) hours as needed for pain.      Marland Kitchen atorvastatin (LIPITOR) 40 MG tablet Take 1 tablet (40 mg total) by mouth daily.  90 tablet  0  . BIOTIN PO Take 1 tablet by mouth daily.      . celecoxib (CELEBREX) 200 MG capsule Take 1 capsule (200 mg total) by mouth 2 (two) times daily.  60 capsule  2  . cetirizine (ZYRTEC) 10 MG tablet Take 10 mg by mouth daily.      . Diclofenac (ZORVOLEX) 35 MG CAPS 1 pill three times daily as needed for pain  90 capsule  4  . losartan-hydrochlorothiazide (HYZAAR) 50-12.5 MG per tablet Take 1 tablet by mouth daily.      . Melatonin 1 MG CAPS Take 3 mg by mouth daily.      . Multiple Vitamins-Minerals (MULTIVITAMIN PO) Take 1 tablet by mouth daily.      Marland Kitchen neomycin-polymyxin-hydrocortisone (CORTISPORIN) otic solution Place 3 drops into both ears 4 (four) times daily.      Marland Kitchen nystatin (MYCOSTATIN/NYSTOP) 100000  UNIT/GM POWD Apply daily after shower for yeast  30 g  4  . phentermine (ADIPEX-P) 37.5 MG tablet TAKE ONE TABLET BY MOUTH ONCE DAILY BEFORE BREAKFAST.  30 tablet  2  . sertraline (ZOLOFT) 100 MG tablet Take 300 mg by mouth daily.      . traZODone (DESYREL) 100 MG tablet Take 200 mg by mouth at bedtime.      . Vitamin D, Ergocalciferol, (DRISDOL) 50000 UNITS CAPS capsule Take 50,000 Units by mouth every 30 (thirty) days.       No current facility-administered medications for this visit.    Review of Systems Review of Systems  Constitutional: Negative.   HENT: Negative.   Eyes: Negative.   Respiratory: Negative.   Cardiovascular: Negative.   Gastrointestinal: Negative.   Endocrine: Negative.   Genitourinary: Negative.   Musculoskeletal: Positive for back pain.  Skin: Negative.   Allergic/Immunologic: Negative.   Neurological: Negative.   Hematological: Negative.   Psychiatric/Behavioral: Negative.     There were no vitals taken for this visit.  Physical  Exam Physical Exam  Constitutional: She is oriented to person, place, and time. She appears well-developed and well-nourished.  HENT:  Head: Normocephalic and atraumatic.  Eyes: Conjunctivae and EOM are normal. Pupils are equal, round, and reactive to light.  Neck: Normal range of motion. Neck supple.  Cardiovascular: Normal rate, regular rhythm and normal heart sounds.   Pulmonary/Chest: Effort normal and breath sounds normal.  There is a palpable bruise in the lateral aspect of the left breast. There is no palpable mass in the right breast. There is no palpable axillary, supraclavicular, or cervical lymphadenopathy. She does have extremely large breasts bilaterally  Abdominal: Soft. Bowel sounds are normal.  Musculoskeletal: Normal range of motion.  Lymphadenopathy:    She has no cervical adenopathy.  Neurological: She is alert and oriented to person, place, and time.  Skin: Skin is warm and dry.  Psychiatric: She has a normal mood and affect. Her behavior is normal.    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 I have talked to her in detail about the different options for treatment. Given the size of the area I think she would be best treated with mastectomy and sentinel node evaluation. I have discussed with her the risks and benefits of surgery as well as some of the technical aspects and she understands and wishes to proceed. We also talked about reconstruction but she would like to wait on this for now.  Because of the cumbersome size of her breasts and her own personal discomfort she desires bilateral mastectomy and I think this is reasonable for her.    Plan    Plan for bilateral mastectomy and left sentinel node mapping       TOTH III,PAUL S 03/09/2014, 1:54 PM

## 2014-03-31 NOTE — Transfer of Care (Signed)
Immediate Anesthesia Transfer of Care Note  Patient: Brooke Hoover  Procedure(s) Performed: Procedure(s): BILATERAL MASTECTOMY WITH LEFT SENTINEL LYMPH NODE MAPPING (Bilateral)  Patient Location: PACU  Anesthesia Type:General  Level of Consciousness: awake, alert, and oriented  Airway & Oxygen Therapy: Patient Spontanous Breathing and Patient connected to face mask oxygen  Post-op Assessment: Report given to PACU RN, Post -op Vital signs reviewed and stable and Patient moving all extremities X 4  Post vital signs: Reviewed and stable  Complications: No apparent anesthesia complications

## 2014-03-31 NOTE — Anesthesia Preprocedure Evaluation (Signed)
Anesthesia Evaluation  Patient identified by MRN, date of birth, ID band Patient awake    Reviewed: Allergy & Precautions, H&P , NPO status , Patient's Chart, lab work & pertinent test results, reviewed documented beta blocker date and time   Airway Mallampati: II TM Distance: >3 FB Neck ROM: full    Dental  (+) Teeth Intact, Dental Advidsory Given   Pulmonary neg pulmonary ROS,  breath sounds clear to auscultation        Cardiovascular On Medications negative cardio ROS  Rhythm:regular Rate:Normal     Neuro/Psych PSYCHIATRIC DISORDERS negative neurological ROS  negative psych ROS   GI/Hepatic negative GI ROS, Neg liver ROS,   Endo/Other  negative endocrine ROS  Renal/GU negative Renal ROS     Musculoskeletal   Abdominal   Peds  Hematology   Anesthesia Other Findings   Reproductive/Obstetrics negative OB ROS                           Anesthesia Physical Anesthesia Plan  ASA: III  Anesthesia Plan: General LMA   Post-op Pain Management: MAC Combined w/ Regional for Post-op pain   Induction:   Airway Management Planned:   Additional Equipment:   Intra-op Plan:   Post-operative Plan:   Informed Consent: I have reviewed the patients History and Physical, chart, labs and discussed the procedure including the risks, benefits and alternatives for the proposed anesthesia with the patient or authorized representative who has indicated his/her understanding and acceptance.   Dental Advisory Given  Plan Discussed with: Anesthesiologist, CRNA and Surgeon  Anesthesia Plan Comments:         Anesthesia Quick Evaluation

## 2014-03-31 NOTE — Interval H&P Note (Signed)
History and Physical Interval Note:  03/31/2014 7:05 AM  Brooke Hoover  has presented today for surgery, with the diagnosis of left breast dcis  The various methods of treatment have been discussed with the patient and family. After consideration of risks, benefits and other options for treatment, the patient has consented to  Procedure(s): [place] as a surgical intervention .  The patient's history has been reviewed, patient examined, no change in status, stable for surgery.  I have reviewed the patient's chart and labs.  Questions were answered to the patient's satisfaction.     TOTH III,Phiona Ramnauth S

## 2014-03-31 NOTE — Op Note (Signed)
03/31/2014  10:52 AM  PATIENT:  Brooke Hoover  63 y.o. female  PRE-OPERATIVE DIAGNOSIS:  LEFT BREAST DCIS  POST-OPERATIVE DIAGNOSIS:  LEFT BREAST DCIS  PROCEDURE:  Procedure(s): BILATERAL MASTECTOMY WITH LEFT SENTINEL LYMPH NODE MAPPING  SURGEON:  Surgeon(s) and Role:    * Merrie Roof, MD - Primary  PHYSICIAN ASSISTANT:   ASSISTANTS: RNFA   ANESTHESIA:   general  EBL:  Total I/O In: 1000 [I.V.:1000] Out: 100 [Blood:100]  BLOOD ADMINISTERED:none  DRAINS: (4) Jackson-Pratt drain(s) with closed bulb suction in the prepectoral space   LOCAL MEDICATIONS USED:  NONE  SPECIMEN:  Source of Specimen:  bilateral breasts and left sentinel nodes  DISPOSITION OF SPECIMEN:  PATHOLOGY  COUNTS:  YES  TOURNIQUET:  * No tourniquets in log *  DICTATION: .Dragon Dictation After informed consent was obtained the patient was brought to the operating room and placed in the supine position on the operating table. After adequate induction of general anesthesia the patient's bilateral breast, chest, and axillary areas were prepped with ChloraPrep, allowed to dry, and draped in usual sterile manner. Earlier in the day the patient underwent injection of 1 mCi of technetium sulfur colloid in the subareolar position on the left. At this point attention was first turned to the right breast. An elliptical incision was made around the nipple and areola complex In order to minimize the excess skin. The this incision was carried through the skin and subcutaneous tissue sharply with the plasma blade. Once into the breast tissue breast which were used to elevate the skin flaps anteriorly towards the ceiling. Thin skin flaps were created circumferentially between the breast tissue and subcutaneous fat. This dissection was carried all the way to the chest wall. Next the breast was removed from the pectoralis muscle with the pectoralis fascia. This was also done sharply with the plasma blade. Once the  breast was removed it was oriented with a short stitch on the superior surface and a long stitch on the lateral surface and sent to pathology for further evaluation. Hemostasis was achieved using the electrocautery. The wound was irrigated with copious amounts of saline. 2 small stab incisions were made in the anterior axillary line below the operative area with a 15 blade knife. A tonsil clamp was used to bring two 19 [place] round Blake drains into the operative bed. The medial drain was curled along the chest wall and the lateral drain was placed in the axilla. The drains were anchored to the skin with a 3-0 nylon stitch. The superior and inferior flap were grossly reapproximated with interrupted 0 Vicryl stitches. The skin was then closed with a running 4-0 Monocryl subcuticular stitch. Attention was then turned to the left breast. A similar elliptical incision was made in order to minimize the excess skin. This incision was carried through the skin and subcutaneous tissue sharply with the plasma blade. Breast which were used to elevate the skin flaps anteriorly towards the ceiling. Thin skin flaps were created circumferentially between the breast tissue and subcutaneous fat. This dissection was carried all the way to the chest wall. Laterally when we reached the axilla we were able to use the neoprobe to identify a hot spot in the left axilla. This area was excised sharply with the electrocautery. Ex vivo counts on the sentinel node were approximately 1100. A second sentinel node was also identified with ex vivo counts of around 100 and this was excised sharply with the plasma blade. 2 other palpable lymph  nodes in the same area were also excised sharply with the plasma blade and sent as sentinel nodes #3 and 4 that were palpable. Touch preps on all these nodes were negative. The wound was irrigated with copious amounts of saline. Hemostasis was achieved using the plasma blade. 2 small stab incisions were made  in the anterior axillary line below the operative area with a 15 blade knife. A tonsil clamp was used to bring two 19 [place] round Blake drains into the operative bed. The medial drain was curled along the chest wall and the lateral drain was placed in the axilla. The drains were anchored to the skin with a 3-0 nylon stitch. The superior and inferior skin flaps were grossly reapproximated with interrupted 3-0 Vicryl stitches. The skin incision was then closed with running 4-0 Monocryl subcuticular stitches. Dermabond dressings were applied. The patient tolerated the procedure well. At the end of the case all needle sponge and instrument counts were correct. The patient was then awakened and taken to recovery in stable condition.  PLAN OF CARE: Admit for overnight observation  PATIENT DISPOSITION:  PACU - hemodynamically stable.   Delay start of Pharmacological VTE agent (>24hrs) due to surgical blood loss or risk of bleeding: no

## 2014-03-31 NOTE — Progress Notes (Signed)
PINK arm band applied to Left Arm as a restricted arm

## 2014-04-01 ENCOUNTER — Encounter (HOSPITAL_COMMUNITY): Payer: Self-pay | Admitting: General Surgery

## 2014-04-01 NOTE — Anesthesia Postprocedure Evaluation (Signed)
Anesthesia Post Note  Patient: Brooke Hoover  Procedure(s) Performed: Procedure(s) (LRB): BILATERAL MASTECTOMY WITH LEFT SENTINEL LYMPH NODE MAPPING (Bilateral)  Anesthesia type: general  Patient location: PACU  Post pain: Pain level controlled  Post assessment: Patient's Cardiovascular Status Stable  Post vital signs: Reviewed and stable  Level of consciousness: sedated  Complications: No apparent anesthesia complications

## 2014-04-01 NOTE — Progress Notes (Signed)
Patient discharged to home with instructions. 

## 2014-04-01 NOTE — Progress Notes (Signed)
1 Day Post-Op  Subjective: No complaints. She is doing well  Objective: Vital signs in last 24 hours: Temp:  [97.4 F (36.3 C)-98.3 F (36.8 C)] 98.3 F (36.8 C) (07/17 0529) Pulse Rate:  [54-125] 70 (07/17 0529) Resp:  [12-16] 15 (07/17 0529) BP: (114-158)/(45-75) 115/50 mmHg (07/17 0529) SpO2:  [94 %-99 %] 95 % (07/17 0529) Weight:  [232 lb 9.6 oz (105.507 kg)] 232 lb 9.6 oz (105.507 kg) (07/16 1300) Last BM Date: 03/31/14  Intake/Output from previous day: 07/16 0701 - 07/17 0700 In: 3437.5 [P.O.:1120; I.V.:2317.5] Out: 326 [Drains:226; Blood:100] Intake/Output this shift: Total I/O In: 1117.5 [I.V.:1117.5] Out: 65 [Drains:65]  Resp: clear to auscultation bilaterally Chest wall: skin flaps look healthy Cardio: regular rate and rhythm GI: soft, non-tender; bowel sounds normal; no masses,  no organomegaly  Lab Results:  No results found for this basename: WBC, HGB, HCT, PLT,  in the last 72 hours BMET No results found for this basename: NA, K, CL, CO2, GLUCOSE, BUN, CREATININE, CALCIUM,  in the last 72 hours PT/INR No results found for this basename: LABPROT, INR,  in the last 72 hours ABG No results found for this basename: PHART, PCO2, PO2, HCO3,  in the last 72 hours  Studies/Results: Nm Sentinel Node Inj-no Rpt (breast)  03/31/2014   CLINICAL DATA: left mastectomy for dcis   Sulfur colloid was injected intradermally by the nuclear medicine  technologist for breast cancer sentinel node localization.     Anti-infectives: Anti-infectives   Start     Dose/Rate Route Frequency Ordered Stop   03/31/14 0600  ceFAZolin (ANCEF) IVPB 2 g/50 mL premix     2 g 100 mL/hr over 30 Minutes Intravenous On call to O.R. 03/30/14 1412 03/31/14 0737      Assessment/Plan: s/p Procedure(s): BILATERAL MASTECTOMY WITH LEFT SENTINEL LYMPH NODE MAPPING (Bilateral) Advance diet Discharge  LOS: 1 day    TOTH III,PAUL S 04/01/2014

## 2014-04-01 NOTE — Discharge Summary (Signed)
Physician Discharge Summary  Patient ID: Brooke Hoover MRN: [id number] DOB/AGE: 1951/05/26 64 y.o.  Admit date: 03/31/2014 Discharge date: 04/01/2014  Admission Diagnoses:  Discharge Diagnoses:  Active Problems:   Neoplasm of left breast, primary tumor staging category Tis: ductal carcinoma in situ (DCIS)   Discharged Condition: good  Hospital Course: the pt underwent bilateral mastectomy and left sentinel node biopsy. She tolerated surgery well. On pod 1 she is ready for discharge home  Consults: None  Significant Diagnostic Studies: none  Treatments: surgery: as above  Discharge Exam: Blood pressure 115/50, pulse 70, temperature 98.3 F (36.8 C), temperature source Oral, resp. rate 15, height 5\' 3"  (1.6 m), weight 232 lb 9.6 oz (105.507 kg), SpO2 95.00%. Resp: clear to auscultation bilaterally Chest wall: skin flaps look good Cardio: regular rate and rhythm GI: soft, non-tender; bowel sounds normal; no masses,  no organomegaly  Disposition: Final discharge disposition not confirmed  Discharge Instructions   Call MD for:  difficulty breathing, headache or visual disturbances    Complete by:  As directed      Call MD for:  extreme fatigue    Complete by:  As directed      Call MD for:  hives    Complete by:  As directed      Call MD for:  persistant dizziness or light-headedness    Complete by:  As directed      Call MD for:  persistant nausea and vomiting    Complete by:  As directed      Call MD for:  redness, tenderness, or signs of infection (pain, swelling, redness, odor or green/yellow discharge around incision site)    Complete by:  As directed      Call MD for:  severe uncontrolled pain    Complete by:  As directed      Call MD for:  temperature >100.4    Complete by:  As directed      Diet - low sodium heart healthy    Complete by:  As directed      Discharge instructions    Complete by:  As directed   Sponge bathe while drains are in. No overhead  activity. Empty drains, record output, and recharge bulb twice a day     Increase activity slowly    Complete by:  As directed      No wound care    Complete by:  As directed             Medication List         acetaminophen 650 MG CR tablet  Commonly known as:  TYLENOL  Take 650 mg by mouth every 8 (eight) hours as needed for pain.     atorvastatin 40 MG tablet  Commonly known as:  LIPITOR  Take 1 tablet (40 mg total) by mouth daily.     BIOTIN PO  Take 1 tablet by mouth daily.     cetirizine 10 MG tablet  Commonly known as:  ZYRTEC  Take 10 mg by mouth daily.     losartan-hydrochlorothiazide 50-12.5 MG per tablet  Commonly known as:  HYZAAR  Take 1 tablet by mouth daily.     Melatonin 1 MG Caps  Take 3 mg by mouth at bedtime.     MULTIVITAMIN PO  Take 1 tablet by mouth daily.     neomycin-polymyxin-hydrocortisone otic solution  Commonly known as:  CORTISPORIN  Place 3 drops into both ears once a week.  nystatin 100000 UNIT/GM Powd  Apply 1 g topically daily. Under breast for yeast.     phentermine 37.5 MG tablet  Commonly known as:  ADIPEX-P  Take 37.5 mg by mouth every 3 (three) days.     traZODone 100 MG tablet  Commonly known as:  DESYREL  Take 200 mg by mouth at bedtime.     VITAMIN D PO  Take 1 tablet by mouth daily.     ZOLOFT 100 MG tablet  Generic drug:  sertraline  Take 200 mg by mouth daily.           Follow-up Information   Follow up with Merrie Roof, MD In 2 weeks.   Specialty:  General Surgery   Contact information:   [address] [place] [address] [phone]       Signed: Merrie Roof 04/01/2014, 6:59 AM

## 2014-04-05 ENCOUNTER — Telehealth (INDEPENDENT_AMBULATORY_CARE_PROVIDER_SITE_OTHER): Payer: Self-pay

## 2014-04-05 NOTE — Telephone Encounter (Signed)
Message copied by Carlene Coria on Tue Apr 05, 2014  4:22 PM ------      Message from: Jeanann Lewandowsky      Created: Mon Apr 04, 2014  8:59 AM      Regarding: FW: po appt       Sharyn Lull,             I called this pt with this p/o appt.  Pt states she has 4 drains.  When should she come in and have them removed?  Isn't it when she has less than 30 ml's for 3 days?              Please advise!            Anderson Malta      ----- Message -----         From: Ivor Costa, CMA         Sent: 04/01/2014   5:18 PM           To: Carlene Coria, CMA, Jeanann Lewandowsky, CMA      Subject: po appt                                                  Anderson Malta,            Please call patient to make aware of po appt scheduled for 04/25/14 @ 11:00 w/Dr. Dossie Der      ----- Message -----         From: Jeanann Lewandowsky, CMA         Sent: 04/01/2014   2:45 PM           To: Carlene Coria, CMA            Pt needs p/o appt for bilateral masty.  I don't see anything for 4 weeks out.            Thanks!             ------

## 2014-04-05 NOTE — Telephone Encounter (Signed)
Called pt with appt 

## 2014-04-11 ENCOUNTER — Other Ambulatory Visit: Payer: Self-pay | Admitting: Internal Medicine

## 2014-04-11 ENCOUNTER — Other Ambulatory Visit: Payer: Self-pay | Admitting: Physician Assistant

## 2014-04-11 MED ORDER — LEVOFLOXACIN 500 MG PO TABS: ORAL_TABLET | ORAL | Status: DC

## 2014-04-12 ENCOUNTER — Ambulatory Visit (INDEPENDENT_AMBULATORY_CARE_PROVIDER_SITE_OTHER): Payer: BC Managed Care – PPO | Admitting: General Surgery

## 2014-04-12 ENCOUNTER — Encounter (INDEPENDENT_AMBULATORY_CARE_PROVIDER_SITE_OTHER): Payer: Self-pay | Admitting: General Surgery

## 2014-04-12 VITALS — BP 130/82 | HR 110 | Temp 97.2°F | Ht 63.0 in | Wt 213.0 lb

## 2014-04-12 DIAGNOSIS — C50412 Malignant neoplasm of upper-outer quadrant of left female breast: Secondary | ICD-10-CM

## 2014-04-12 DIAGNOSIS — C50419 Malignant neoplasm of upper-outer quadrant of unspecified female breast: Secondary | ICD-10-CM

## 2014-04-12 NOTE — Progress Notes (Signed)
Subjective:     Patient ID: Brooke Hoover, female   DOB: Apr 08, 1951, 63 y.o.   MRN: [id number]  HPI The patient is 2 weeks status post bilateral mastectomies and left sentinel lymph node mapping for a left-sided T1 A. N0 breast cancer. She states that she developed a postop infection on the right side. Her medical doctor started her on Levaquin.  Review of Systems     Objective:   Physical Exam On exam both mastectomy incisions are healing nicely. She has a slight area of redness in the right axilla. Otherwise the skin flaps Looked healthy.     Assessment:     The patient is 2 weeks status post bilateral mastectomies for a left-sided breast cancer     Plan:     At this point I will remove one drain on each side. I will plan to see her back in one week to hopefully remove the second drains. She will continue on the Levaquin

## 2014-04-12 NOTE — Patient Instructions (Signed)
Continue to record output from remaining drains

## 2014-04-19 ENCOUNTER — Ambulatory Visit (INDEPENDENT_AMBULATORY_CARE_PROVIDER_SITE_OTHER): Payer: BC Managed Care – PPO | Admitting: General Surgery

## 2014-04-19 ENCOUNTER — Other Ambulatory Visit: Payer: Self-pay | Admitting: *Deleted

## 2014-04-19 VITALS — BP 126/86 | HR 73 | Temp 97.8°F | Ht 63.0 in | Wt 212.0 lb

## 2014-04-19 DIAGNOSIS — C50412 Malignant neoplasm of upper-outer quadrant of left female breast: Secondary | ICD-10-CM

## 2014-04-19 DIAGNOSIS — C50419 Malignant neoplasm of upper-outer quadrant of unspecified female breast: Secondary | ICD-10-CM

## 2014-04-19 NOTE — Patient Instructions (Signed)
May shower on wednesday

## 2014-04-19 NOTE — Progress Notes (Signed)
Subjective:     Patient ID: Brooke Hoover, female   DOB: 05-18-1951, 63 y.o.   MRN: [id number]  HPI The patient is a 63 year old white female who is 3 weeks status post bilateral mastectomies and left sentinel lymph node biopsy for a left-sided T1aN0 breast cancer. At her last visit we removed the medial drains on both sides. She has had minimal output from the lateral drain since her last visit.  Review of Systems     Objective:   Physical Exam On exam both mastectomy incisions are healing nicely. The redness on the lateral aspect of the right side has pretty much resolved. The remaining drains were removed without difficulty and she tolerated this well. Her skin flaps are healthy.    Assessment:     The patient is 3 weeks status post bilateral mastectomies for a left-sided breast cancer     Plan:     At this point I will plan to see her back in one week to check for any residual fluid. If her redness return she agrees to call us immediately and we will restart the Levaquin.

## 2014-04-20 ENCOUNTER — Other Ambulatory Visit (HOSPITAL_BASED_OUTPATIENT_CLINIC_OR_DEPARTMENT_OTHER): Payer: BC Managed Care – PPO

## 2014-04-20 ENCOUNTER — Ambulatory Visit (HOSPITAL_BASED_OUTPATIENT_CLINIC_OR_DEPARTMENT_OTHER): Payer: BC Managed Care – PPO | Admitting: Adult Health

## 2014-04-20 ENCOUNTER — Encounter: Payer: Self-pay | Admitting: Adult Health

## 2014-04-20 VITALS — BP 135/54 | HR 88 | Temp 97.6°F | Resp 18 | Ht 63.0 in | Wt 213.2 lb

## 2014-04-20 DIAGNOSIS — C50412 Malignant neoplasm of upper-outer quadrant of left female breast: Secondary | ICD-10-CM

## 2014-04-20 DIAGNOSIS — D0590 Unspecified type of carcinoma in situ of unspecified breast: Secondary | ICD-10-CM

## 2014-04-20 DIAGNOSIS — C50419 Malignant neoplasm of upper-outer quadrant of unspecified female breast: Secondary | ICD-10-CM

## 2014-04-20 DIAGNOSIS — Z170 Estrogen receptor positive status [ER+]: Secondary | ICD-10-CM

## 2014-04-20 LAB — CBC WITH DIFFERENTIAL/PLATELET
BASO%: 0.6 % (ref 0.0–2.0)
BASOS ABS: 0 10*3/uL (ref 0.0–0.1)
EOS ABS: 0.1 10*3/uL (ref 0.0–0.5)
EOS%: 1.5 % (ref 0.0–7.0)
HEMATOCRIT: 37.8 % (ref 34.8–46.6)
HEMOGLOBIN: 12.1 g/dL (ref 11.6–15.9)
LYMPH#: 2.1 10*3/uL (ref 0.9–3.3)
LYMPH%: 27.9 % (ref 14.0–49.7)
MCH: 28 pg (ref 25.1–34.0)
MCHC: 32.2 g/dL (ref 31.5–36.0)
MCV: 87.2 fL (ref 79.5–101.0)
MONO#: 0.8 10*3/uL (ref 0.1–0.9)
MONO%: 11.3 % (ref 0.0–14.0)
NEUT%: 58.7 % (ref 38.4–76.8)
NEUTROS ABS: 4.3 10*3/uL (ref 1.5–6.5)
Platelets: 441 10*3/uL — ABNORMAL HIGH (ref 145–400)
RBC: 4.33 10*6/uL (ref 3.70–5.45)
RDW: 13.9 % (ref 11.2–14.5)
WBC: 7.4 10*3/uL (ref 3.9–10.3)

## 2014-04-20 LAB — COMPREHENSIVE METABOLIC PANEL (CC13)
ALT: 19 U/L (ref 0–55)
ANION GAP: 9 meq/L (ref 3–11)
AST: 21 U/L (ref 5–34)
Albumin: 3.5 g/dL (ref 3.5–5.0)
Alkaline Phosphatase: 117 U/L (ref 40–150)
BUN: 14.9 mg/dL (ref 7.0–26.0)
CO2: 29 mEq/L (ref 22–29)
CREATININE: 0.7 mg/dL (ref 0.6–1.1)
Calcium: 9.7 mg/dL (ref 8.4–10.4)
Chloride: 104 mEq/L (ref 98–109)
Glucose: 79 mg/dl (ref 70–140)
Potassium: 4.2 mEq/L (ref 3.5–5.1)
Sodium: 142 mEq/L (ref 136–145)
Total Bilirubin: 0.25 mg/dL (ref 0.20–1.20)
Total Protein: 7.1 g/dL (ref 6.4–8.3)

## 2014-04-20 NOTE — Progress Notes (Addendum)
ID: [name] OB: [birth year]  MR#: [id number]  CSN#:[id number]  PCP: Alesia Richards, MD GYN:   SU: Dr. Marlou Starks OTHER MD:  CHIEF COMPLAINT: follow up after surgery  BREAST CANCER HISTORY:  Brooke Hoover is a 63 y.o. female.  with history of hypertension, hyperlipidemia, depression, arthritis who had a routine screening mammogram in 02/08/2014 that revealed left breast calcifications. 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 It is positive for Estrogen Receptor: 100%, POSITIVE, Progesterone Receptor: 92%, POSITIVE. Bilateral breast MRI performed in 03/04/2014 revealed approximately 4.5x9.3x4.8cm. clumped non-mass enhancement over the central left breast from 3:00 position 2:00 position corresponding to her  biopsy site.    INTERVAL HISTORY: Brooke Hoover is here with her daughter and husband for evaluation following surgery for anti estrogen therapy.  She is doing well today.   She underwent surgery on 03/31/2014.  She underwent bilateral mastectomies.  She has been doing well since surgery.  She did have a small infection along her right mastectomy site that was treated with antibiotics, however cleared up.  Otherwise, she is doing well.    REVIEW OF SYSTEMS: A 10 point review of systems was conducted and is otherwise negative except for what is noted above.     PAST MEDICAL HISTORY: Past Medical History  Diagnosis Date  . Hypertension   . Hyperlipidemia   . Obesity   . Allergy   . Depression   . Arthritis   . Cancer     Cervical   . Vitamin D deficiency   . Eczema   . Prediabetes   . Anxiety   . Cervical cancer   . Ductal carcinoma of breast     PAST SURGICAL HISTORY: Past Surgical History  Procedure Laterality Date  . Cholecystectomy    . Abdominal  hysterectomy      Partial, due to cervical cancer  . Roux-en-y procedure  1998  . Tubal ligation    . Complete mastectomy w/ sentinel node biopsy Bilateral 03/31/2014    DR TOTH  . Mastectomy w/ sentinel node biopsy Bilateral 03/31/2014    Procedure: BILATERAL MASTECTOMY WITH LEFT SENTINEL LYMPH NODE MAPPING;  Surgeon: Merrie Roof, MD;  Location: [facility];  Service: General;  Laterality: Bilateral;    FAMILY HISTORY Family History  Problem Relation Age of Onset  . Stroke Mother   . Hyperlipidemia Mother   . Hypertension Mother   . Depression Mother   . Cancer Father     Melanoma  . Cancer Sister     bone   . Hypertension Sister   . Hyperlipidemia Sister   . Depression Sister   . Depression Brother   . Heart disease Brother   . Hyperlipidemia Brother   . Hypertension Brother   . COPD Brother   . Cancer Sister 68    breast cancer double masectomy  . Depression Sister   . Depression Sister     GYNECOLOGIC HISTORY: She attained her menarche at the age of 33 or 3. She had her first child when she was 74 years old. She is not on any hormonal replacement therapy. She underwent hysterectomy at the age of 89 for cervical cancer. She did not require chemotherapy/ radiation at that time.  SOCIAL HISTORY: She does not have any history of tobacco abuse or alcohol use.     ADVANCED DIRECTIVES:  Not in place   HEALTH MAINTENANCE: History  Substance Use Topics  . Smoking status: Passive Smoke Exposure - Never Smoker  . Smokeless tobacco: Never Used  . Alcohol Use: No      Mammogram: s/p bilateral mastectomy Colonoscopy: Bone Density Scan:  Pap Smear:  Eye Exam:  Vitamin D Level:   Lipid Panel:    Allergies  Allergen Reactions  . Lisinopril Diarrhea    Pt. Stated, "ACE inhibitors give me diarrhea"  . Morphine And Related Other (See Comments)    Dry heaves    Current Outpatient Prescriptions  Medication Sig Dispense Refill  . acetaminophen (TYLENOL) 650 MG CR  tablet Take 650 mg by mouth every 8 (eight) hours as needed for pain.      Marland Kitchen atorvastatin (LIPITOR) 40 MG tablet Take 1 tablet (40 mg total) by mouth daily.  90 tablet  0  . BIOTIN PO Take 1 tablet by mouth daily.      . cetirizine (ZYRTEC) 10 MG tablet Take 10 mg by mouth daily.      . Cholecalciferol (VITAMIN D PO) Take 1 tablet by mouth daily.      . hydrochlorothiazide (HYDRODIURIL) 25 MG tablet       . losartan-hydrochlorothiazide (HYZAAR) 50-12.5 MG per tablet Take 1 tablet by mouth daily.      . Melatonin 1 MG CAPS Take 3 mg by mouth at bedtime.       . Multiple Vitamins-Minerals (MULTIVITAMIN PO) Take 1 tablet by mouth daily.      Marland Kitchen neomycin-polymyxin-hydrocortisone (CORTISPORIN) otic solution Place 3 drops into both ears once a week.       . phentermine (ADIPEX-P) 37.5 MG tablet Take 37.5 mg by mouth every 3 (three) days.      Marland Kitchen sertraline (ZOLOFT) 100 MG tablet Take 200 mg by mouth daily.       . traZODone (DESYREL) 100 MG tablet Take 200 mg by mouth at bedtime.       No current facility-administered medications for this visit.    OBJECTIVE: Filed Vitals:   04/20/14 1313  BP: 135/54  Pulse: 88  Temp: 97.6 F (36.4 C)  Resp: 18     Body mass index is 37.78 kg/(m^2).     GENERAL: Patient is a well appearing female in no acute distress HEENT:  Sclerae anicteric.  Oropharynx clear and moist. No ulcerations or evidence of oropharyngeal candidiasis. Neck is supple.  NODES:  No cervical, supraclavicular, or axillary lymphadenopathy palpated.  BREAST EXAM:  S/p bilateral mastectomies, healing well, no erythema, nodularity, or sign of infection LUNGS:  Clear to auscultation bilaterally.  No wheezes or rhonchi. HEART:  Regular rate and rhythm. No murmur appreciated. ABDOMEN:  Soft, nontender.  Positive, normoactive bowel sounds. No organomegaly palpated. MSK:  No focal spinal tenderness to palpation. Full range of motion bilaterally in the upper extremities. EXTREMITIES:  No  peripheral edema.   SKIN:  Clear with no obvious rashes or skin changes. No nail dyscrasia. NEURO:  Nonfocal. Well oriented.  Appropriate affect. ECOG FS:0 - Asymptomatic  LAB RESULTS:  CMP     Component Value Date/Time   NA 142 04/20/2014 1251   NA 140 03/23/2014 1032   K 4.2 04/20/2014 1251   K 4.0 03/23/2014 1032   CL 97 03/23/2014 1032   CO2 29 04/20/2014 1251   CO2 29 03/23/2014 1032   GLUCOSE 79 04/20/2014 1251   GLUCOSE 138* 03/23/2014 1032   BUN 14.9 04/20/2014 1251   BUN  9 03/23/2014 1032   CREATININE 0.7 04/20/2014 1251   CREATININE 0.58 03/23/2014 1032   CREATININE 0.59 02/04/2014 1126   CALCIUM 9.7 04/20/2014 1251   CALCIUM 9.4 03/23/2014 1032   PROT 7.1 04/20/2014 1251   PROT 6.9 02/04/2014 1126   ALBUMIN 3.5 04/20/2014 1251   ALBUMIN 4.2 02/04/2014 1126   AST 21 04/20/2014 1251   AST 24 02/04/2014 1126   ALT 19 04/20/2014 1251   ALT 24 02/04/2014 1126   ALKPHOS 117 04/20/2014 1251   ALKPHOS 80 02/04/2014 1126   BILITOT 0.25 04/20/2014 1251   BILITOT 0.4 02/04/2014 1126   GFRNONAA >90 03/23/2014 1032   GFRNONAA >89 02/04/2014 1126   GFRAA >90 03/23/2014 1032   GFRAA >89 02/04/2014 1126    I No results found for this basename: SPEP, UPEP,  kappa and lambda light chains    Lab Results  Component Value Date   WBC 7.4 04/20/2014   NEUTROABS 4.3 04/20/2014   HGB 12.1 04/20/2014   HCT 37.8 04/20/2014   MCV 87.2 04/20/2014   PLT 441* 04/20/2014      Chemistry      Component Value Date/Time   NA 142 04/20/2014 1251   NA 140 03/23/2014 1032   K 4.2 04/20/2014 1251   K 4.0 03/23/2014 1032   CL 97 03/23/2014 1032   CO2 29 04/20/2014 1251   CO2 29 03/23/2014 1032   BUN 14.9 04/20/2014 1251   BUN 9 03/23/2014 1032   CREATININE 0.7 04/20/2014 1251   CREATININE 0.58 03/23/2014 1032   CREATININE 0.59 02/04/2014 1126      Component Value Date/Time   CALCIUM 9.7 04/20/2014 1251   CALCIUM 9.4 03/23/2014 1032   ALKPHOS 117 04/20/2014 1251   ALKPHOS 80 02/04/2014 1126   AST 21 04/20/2014 1251   AST 24 02/04/2014 1126   ALT 19 04/20/2014 1251    ALT 24 02/04/2014 1126   BILITOT 0.25 04/20/2014 1251   BILITOT 0.4 02/04/2014 1126       No results found for this basename: LABCA2    No components found with this basename: LABCA125    No results found for this basename: INR,  in the last 168 hours  Urinalysis    Component Value Date/Time   COLORURINE YELLOW 11/05/2013 [place] 11/05/2013 1114   LABSPEC 1.009 11/05/2013 1114   PHURINE 7.0 11/05/2013 1114   GLUCOSEU NEG 11/05/2013 1114   HGBUR NEG 11/05/2013 1114   BILIRUBINUR NEG 11/05/2013 1114   KETONESUR NEG 11/05/2013 1114   PROTEINUR NEG 11/05/2013 1114   UROBILINOGEN 0.2 11/05/2013 1114   NITRITE NEG 11/05/2013 1114   LEUKOCYTESUR NEG 11/05/2013 1114    STUDIES: Dg Chest 2 View  03/23/2014   CLINICAL DATA:  History hypertension.  Preoperative for mastectomy.  EXAM: CHEST  2 VIEW  COMPARISON:  None.  FINDINGS: The heart size and mediastinal contours are within normal limits. There is no focal infiltrate, pulmonary edema, or pleural effusion. There is a 2 mm calcified granuloma in the left mid to lung base. There is scoliosis and degenerative joint changes of the spine.  IMPRESSION: No active cardiopulmonary disease.   Electronically Signed   By: Abelardo Diesel M.D.   On: 03/23/2014 13:02   Nm Sentinel Node Inj-no Rpt (breast)  03/31/2014   CLINICAL DATA: left mastectomy for dcis   Sulfur colloid was injected intradermally by the nuclear medicine  technologist for breast cancer sentinel node localization.  ASSESSMENT: 63 y.o. 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  1.  Patient underwent bilateral mastectomies on 03/31/2014 by Dr. Marlou Starks.  A 1 and 3 mm invasive tumor was removed from the left breast, with DCIS with papillary features and calcifications.  There was no lymphovascular invasion.  Invasive tumor was 3.5 cm from the nearest margin and DCIS was 2.5 cm from the nearest margin. 4 left axillary lymph nodes  were biopsied and negative.  PLAN:  Aino is doing well following surgery.  Her pathology was reviewed above.  Dr. Jana Hakim reviewed it with her in detail.  Since her cancer was mainly DCIS, and her invasive tumor was millimeters, and she has such wide margins with no breast tissue remaining, she will not be started on anti-estrogen therapy adjuvantly. She will need a physical exam of her mastectomy site by her PCP annually.   The above plan was reviewed with the patient and her family in detail by Dr. Jana Hakim and they are in agreement.  Dr. Jana Hakim will see her back in one year for follow up, and then she will likely graduate.   She knows to call us in the interim for any questions or concerns.  We can certainly see her sooner if needed.  I spent 25 minutes counseling the patient face to face.  The total time spent in the appointment was 30 minutes.  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  Microinvasive ductal carcinoma in situ rarely recurs after adequate local treatment. She has ample margins. Her risk of recurrence is less than 5%. NCCN guidelines recommend consideration of anti-estrogens, and we did that today. The patient has a good understanding of the possible toxicities, side effects and complications of tamoxifen, as well as the aromatase inhibitors.  After much discussion she did not want to expose yourself to the possible problems associated with those medications for the sake of a very  marginal possible decrease in recurrence. This is a reasonable decision, in my view, and we agreed I would see her again in one more time a year from now to clarify any remaining doubts or concerns. At that time if she is comfortable we will release her from followup.   I personally saw this patient and performed a substantive portion of this encounter with the listed APP documented above.   Chauncey Cruel, MD

## 2014-04-25 ENCOUNTER — Ambulatory Visit (INDEPENDENT_AMBULATORY_CARE_PROVIDER_SITE_OTHER): Payer: BC Managed Care – PPO | Admitting: General Surgery

## 2014-04-25 ENCOUNTER — Encounter (INDEPENDENT_AMBULATORY_CARE_PROVIDER_SITE_OTHER): Payer: Self-pay | Admitting: General Surgery

## 2014-04-25 VITALS — BP 118/64 | HR 70 | Resp 16 | Ht 63.0 in | Wt 215.6 lb

## 2014-04-25 DIAGNOSIS — Z9010 Acquired absence of unspecified breast and nipple: Secondary | ICD-10-CM

## 2014-04-25 DIAGNOSIS — C50412 Malignant neoplasm of upper-outer quadrant of left female breast: Secondary | ICD-10-CM

## 2014-04-25 DIAGNOSIS — C50419 Malignant neoplasm of upper-outer quadrant of unspecified female breast: Secondary | ICD-10-CM

## 2014-04-25 DIAGNOSIS — Z9013 Acquired absence of bilateral breasts and nipples: Secondary | ICD-10-CM

## 2014-04-25 NOTE — Patient Instructions (Signed)
Will refer to physical therapy 

## 2014-04-25 NOTE — Progress Notes (Signed)
Subjective:     Patient ID: Brooke Hoover, female   DOB: 04-Jul-1951, 63 y.o.   MRN: [id number]  HPI The patient is a 63 year old white female who is 4 weeks status post bilateral mastectomies and left sentinel node biopsy for a left-sided T1 A. N0 breast cancer. She did have some cellulitis that developed laterally on the right chest wall after surgery but this has resolved. Her last drains were removed last week and she tolerated that well. She denies any significant chest wall pain.  Review of Systems     Objective:   Physical Exam On exam both mastectomy incisions are healing nicely with no sign of infection or significant seroma    Assessment:     The patient is 4 weeks status post bilateral mastectomies for a left-sided breast cancer     Plan:     At this point I will refer her to physical therapy to work on her range of motion and strength. I will plan to see her back in 3 months to check her progress

## 2014-04-28 ENCOUNTER — Telehealth: Payer: Self-pay | Admitting: *Deleted

## 2014-04-28 NOTE — Telephone Encounter (Signed)
Received call from Lisa/Second to [facility] requesting dispensing order.  This was done & faxed to [phone] & sent to HIM to scan.

## 2014-05-04 ENCOUNTER — Ambulatory Visit: Payer: BC Managed Care – PPO | Attending: Physical Therapy | Admitting: Physical Therapy

## 2014-05-04 DIAGNOSIS — I10 Essential (primary) hypertension: Secondary | ICD-10-CM | POA: Insufficient documentation

## 2014-05-04 DIAGNOSIS — Z853 Personal history of malignant neoplasm of breast: Secondary | ICD-10-CM | POA: Insufficient documentation

## 2014-05-04 DIAGNOSIS — M129 Arthropathy, unspecified: Secondary | ICD-10-CM | POA: Insufficient documentation

## 2014-05-04 DIAGNOSIS — M1710 Unilateral primary osteoarthritis, unspecified knee: Secondary | ICD-10-CM | POA: Insufficient documentation

## 2014-05-04 DIAGNOSIS — IMO0001 Reserved for inherently not codable concepts without codable children: Secondary | ICD-10-CM | POA: Diagnosis not present

## 2014-05-04 DIAGNOSIS — F329 Major depressive disorder, single episode, unspecified: Secondary | ICD-10-CM | POA: Insufficient documentation

## 2014-05-04 DIAGNOSIS — M24519 Contracture, unspecified shoulder: Secondary | ICD-10-CM | POA: Diagnosis not present

## 2014-05-04 DIAGNOSIS — F3289 Other specified depressive episodes: Secondary | ICD-10-CM | POA: Insufficient documentation

## 2014-05-04 DIAGNOSIS — IMO0002 Reserved for concepts with insufficient information to code with codable children: Secondary | ICD-10-CM

## 2014-05-04 DIAGNOSIS — M19079 Primary osteoarthritis, unspecified ankle and foot: Secondary | ICD-10-CM | POA: Diagnosis not present

## 2014-05-04 DIAGNOSIS — Z9010 Acquired absence of unspecified breast and nipple: Secondary | ICD-10-CM | POA: Diagnosis not present

## 2014-08-05 ENCOUNTER — Encounter: Payer: Self-pay | Admitting: *Deleted

## 2014-11-07 ENCOUNTER — Other Ambulatory Visit: Payer: Self-pay

## 2014-11-10 ENCOUNTER — Other Ambulatory Visit: Payer: Self-pay | Admitting: Physician Assistant

## 2014-11-10 ENCOUNTER — Other Ambulatory Visit: Payer: BLUE CROSS/BLUE SHIELD

## 2014-11-10 DIAGNOSIS — I10 Essential (primary) hypertension: Secondary | ICD-10-CM

## 2014-11-10 DIAGNOSIS — E782 Mixed hyperlipidemia: Secondary | ICD-10-CM

## 2014-11-10 DIAGNOSIS — R7309 Other abnormal glucose: Secondary | ICD-10-CM

## 2014-11-10 DIAGNOSIS — E559 Vitamin D deficiency, unspecified: Secondary | ICD-10-CM

## 2014-11-10 DIAGNOSIS — Z79899 Other long term (current) drug therapy: Secondary | ICD-10-CM

## 2014-11-10 DIAGNOSIS — R7303 Prediabetes: Secondary | ICD-10-CM

## 2014-11-10 DIAGNOSIS — R5383 Other fatigue: Secondary | ICD-10-CM

## 2014-11-10 DIAGNOSIS — C50412 Malignant neoplasm of upper-outer quadrant of left female breast: Secondary | ICD-10-CM

## 2014-11-10 DIAGNOSIS — E785 Hyperlipidemia, unspecified: Secondary | ICD-10-CM

## 2014-11-10 LAB — CBC WITH DIFFERENTIAL/PLATELET
BASOS PCT: 1 % (ref 0–1)
Basophils Absolute: 0.1 10*3/uL (ref 0.0–0.1)
EOS ABS: 0.1 10*3/uL (ref 0.0–0.7)
Eosinophils Relative: 2 % (ref 0–5)
HEMATOCRIT: 37.1 % (ref 36.0–46.0)
Hemoglobin: 11.9 g/dL — ABNORMAL LOW (ref 12.0–15.0)
Lymphocytes Relative: 26 % (ref 12–46)
Lymphs Abs: 1.7 10*3/uL (ref 0.7–4.0)
MCH: 25.8 pg — AB (ref 26.0–34.0)
MCHC: 32.1 g/dL (ref 30.0–36.0)
MCV: 80.3 fL (ref 78.0–100.0)
MPV: 9.4 fL (ref 8.6–12.4)
Monocytes Absolute: 0.5 10*3/uL (ref 0.1–1.0)
Monocytes Relative: 8 % (ref 3–12)
Neutro Abs: 4.2 10*3/uL (ref 1.7–7.7)
Neutrophils Relative %: 63 % (ref 43–77)
PLATELETS: 353 10*3/uL (ref 150–400)
RBC: 4.62 MIL/uL (ref 3.87–5.11)
RDW: 15.8 % — ABNORMAL HIGH (ref 11.5–15.5)
WBC: 6.7 10*3/uL (ref 4.0–10.5)

## 2014-11-11 ENCOUNTER — Other Ambulatory Visit: Payer: Self-pay | Admitting: Physician Assistant

## 2014-11-11 LAB — IRON AND TIBC
%SAT: 8 % — AB (ref 20–55)
Iron: 42 ug/dL (ref 42–145)
TIBC: 515 ug/dL — AB (ref 250–470)
UIBC: 473 ug/dL — ABNORMAL HIGH (ref 125–400)

## 2014-11-11 LAB — HEMOGLOBIN A1C
HEMOGLOBIN A1C: 6.6 % — AB (ref <5.7)
Mean Plasma Glucose: 143 mg/dL — ABNORMAL HIGH (ref <117)

## 2014-11-11 LAB — LIPID PANEL
CHOL/HDL RATIO: 5.3 ratio
Cholesterol: 286 mg/dL — ABNORMAL HIGH (ref 0–200)
HDL: 54 mg/dL (ref >=46)
LDL Cholesterol: 204 mg/dL — ABNORMAL HIGH (ref 0–99)
TRIGLYCERIDES: 140 mg/dL (ref <150)
VLDL: 28 mg/dL (ref 0–40)

## 2014-11-11 LAB — BASIC METABOLIC PANEL WITH GFR
BUN: 15 mg/dL (ref 6–23)
CALCIUM: 9.2 mg/dL (ref 8.4–10.5)
CO2: 25 meq/L (ref 19–32)
CREATININE: 0.66 mg/dL (ref 0.50–1.10)
Chloride: 102 mEq/L (ref 96–112)
GFR, Est African American: >89 mL/min
GFR, Est Non African American: >89 mL/min
Glucose, Bld: 175 mg/dL — ABNORMAL HIGH (ref 70–99)
Potassium: 4.2 mEq/L (ref 3.5–5.3)
Sodium: 138 mEq/L (ref 135–145)

## 2014-11-11 LAB — FERRITIN: Ferritin: 7 ng/mL — ABNORMAL LOW (ref 10–291)

## 2014-11-11 LAB — TSH: TSH: 0.89 u[IU]/mL (ref 0.350–4.500)

## 2014-11-11 LAB — HEPATIC FUNCTION PANEL
ALT: 16 U/L (ref 0–35)
AST: 19 U/L (ref 0–37)
Albumin: 4.4 g/dL (ref 3.5–5.2)
Alkaline Phosphatase: 91 U/L (ref 39–117)
BILIRUBIN DIRECT: 0.1 mg/dL (ref 0.0–0.3)
BILIRUBIN INDIRECT: 0.2 mg/dL (ref 0.2–1.2)
BILIRUBIN TOTAL: 0.3 mg/dL (ref 0.2–1.2)
Total Protein: 7.1 g/dL (ref 6.0–8.3)

## 2014-11-11 LAB — INSULIN, FASTING: Insulin fasting, serum: 38.9 u[IU]/mL — ABNORMAL HIGH (ref 2.0–19.6)

## 2014-11-11 LAB — VITAMIN B12: Vitamin B-12: 1944 pg/mL — ABNORMAL HIGH (ref 211–911)

## 2014-11-11 LAB — VITAMIN D 25 HYDROXY (VIT D DEFICIENCY, FRACTURES): Vit D, 25-Hydroxy: 19 ng/mL — ABNORMAL LOW (ref 30–100)

## 2014-11-11 LAB — MAGNESIUM: MAGNESIUM: 2.1 mg/dL (ref 1.5–2.5)

## 2014-11-11 MED ORDER — ATORVASTATIN CALCIUM 40 MG PO TABS: 40.0000 mg | ORAL_TABLET | Freq: Every day | ORAL | Status: DC

## 2014-11-14 ENCOUNTER — Other Ambulatory Visit: Payer: Self-pay | Admitting: Physician Assistant

## 2014-11-14 ENCOUNTER — Encounter: Payer: Self-pay | Admitting: Physician Assistant

## 2014-11-14 MED ORDER — LOSARTAN POTASSIUM 50 MG PO TABS: 50.0000 mg | ORAL_TABLET | Freq: Every day | ORAL | Status: DC

## 2014-11-14 MED ORDER — CLONAZEPAM 1 MG PO TABS: ORAL_TABLET | ORAL | Status: DC

## 2015-05-04 ENCOUNTER — Other Ambulatory Visit: Payer: Self-pay | Admitting: Physician Assistant

## 2015-05-04 MED ORDER — HYDROCHLOROTHIAZIDE 25 MG PO TABS: 25.0000 mg | ORAL_TABLET | Freq: Every day | ORAL | Status: DC

## 2015-05-15 ENCOUNTER — Encounter: Payer: Self-pay | Admitting: Physician Assistant

## 2015-06-16 ENCOUNTER — Other Ambulatory Visit: Payer: Self-pay | Admitting: Physician Assistant

## 2015-06-16 MED ORDER — FLUCONAZOLE 150 MG PO TABS: 150.0000 mg | ORAL_TABLET | Freq: Every day | ORAL | Status: DC

## 2015-06-16 MED ORDER — CIPROFLOXACIN HCL 500 MG PO TABS: 500.0000 mg | ORAL_TABLET | Freq: Two times a day (BID) | ORAL | Status: DC

## 2015-11-21 ENCOUNTER — Other Ambulatory Visit: Payer: Self-pay | Admitting: Physician Assistant

## 2015-11-21 MED ORDER — LOSARTAN POTASSIUM-HCTZ 50-12.5 MG PO TABS: 1.0000 | ORAL_TABLET | Freq: Every day | ORAL | Status: DC

## 2015-11-21 MED ORDER — ATORVASTATIN CALCIUM 40 MG PO TABS: 40.0000 mg | ORAL_TABLET | Freq: Every day | ORAL | Status: DC

## 2015-11-22 ENCOUNTER — Encounter: Payer: Self-pay | Admitting: Physician Assistant

## 2015-12-27 ENCOUNTER — Encounter: Payer: Self-pay | Admitting: Physician Assistant

## 2016-02-19 ENCOUNTER — Encounter: Payer: Self-pay | Admitting: Physician Assistant

## 2016-03-14 ENCOUNTER — Other Ambulatory Visit: Payer: Self-pay | Admitting: Physician Assistant

## 2016-03-14 MED ORDER — GABAPENTIN 300 MG PO CAPS: ORAL_CAPSULE | ORAL | Status: DC

## 2016-04-13 ENCOUNTER — Other Ambulatory Visit: Payer: Self-pay | Admitting: Physician Assistant

## 2016-04-13 MED ORDER — FLUCONAZOLE 150 MG PO TABS: 150.0000 mg | ORAL_TABLET | Freq: Every day | ORAL | 1 refills | Status: DC

## 2016-04-13 MED ORDER — METRONIDAZOLE 0.75 % VA GEL: 1.0000 | Freq: Every day | VAGINAL | 0 refills | Status: DC

## 2016-06-06 ENCOUNTER — Other Ambulatory Visit: Payer: BC Managed Care – PPO

## 2016-06-06 DIAGNOSIS — I10 Essential (primary) hypertension: Secondary | ICD-10-CM | POA: Diagnosis not present

## 2016-06-06 DIAGNOSIS — E785 Hyperlipidemia, unspecified: Secondary | ICD-10-CM

## 2016-06-06 DIAGNOSIS — E559 Vitamin D deficiency, unspecified: Secondary | ICD-10-CM | POA: Diagnosis not present

## 2016-06-06 DIAGNOSIS — E782 Mixed hyperlipidemia: Secondary | ICD-10-CM | POA: Diagnosis not present

## 2016-06-06 DIAGNOSIS — D649 Anemia, unspecified: Secondary | ICD-10-CM

## 2016-06-06 DIAGNOSIS — Z79899 Other long term (current) drug therapy: Secondary | ICD-10-CM | POA: Diagnosis not present

## 2016-06-06 DIAGNOSIS — R7303 Prediabetes: Secondary | ICD-10-CM

## 2016-06-06 DIAGNOSIS — R7309 Other abnormal glucose: Secondary | ICD-10-CM

## 2016-06-06 DIAGNOSIS — R5383 Other fatigue: Secondary | ICD-10-CM | POA: Diagnosis not present

## 2016-06-06 LAB — CBC WITH DIFFERENTIAL/PLATELET
Basophils Absolute: 0 cells/uL (ref 0–200)
Basophils Relative: 0 %
EOS PCT: 1 %
Eosinophils Absolute: 63 cells/uL (ref 15–500)
HCT: 39.6 % (ref 35.0–45.0)
HEMOGLOBIN: 13.6 g/dL (ref 11.7–15.5)
LYMPHS ABS: 1890 {cells}/uL (ref 850–3900)
LYMPHS PCT: 30 %
MCH: 30 pg (ref 27.0–33.0)
MCHC: 34.3 g/dL (ref 32.0–36.0)
MCV: 87.2 fL (ref 80.0–100.0)
MPV: 9.5 fL (ref 7.5–12.5)
Monocytes Absolute: 504 cells/uL (ref 200–950)
Monocytes Relative: 8 %
NEUTROS PCT: 61 %
Neutro Abs: 3843 cells/uL (ref 1500–7800)
Platelets: 246 10*3/uL (ref 140–400)
RBC: 4.54 MIL/uL (ref 3.80–5.10)
RDW: 14.4 % (ref 11.0–15.0)
WBC: 6.3 10*3/uL (ref 3.8–10.8)

## 2016-06-06 LAB — TSH: TSH: 1.9 mIU/L

## 2016-06-06 LAB — VITAMIN B12: VITAMIN B 12: 1145 pg/mL — AB (ref 200–1100)

## 2016-06-07 LAB — MAGNESIUM: Magnesium: 2.1 mg/dL (ref 1.5–2.5)

## 2016-06-07 LAB — BASIC METABOLIC PANEL WITH GFR
BUN: 13 mg/dL (ref 7–25)
CO2: 27 mmol/L (ref 20–31)
Calcium: 9.6 mg/dL (ref 8.6–10.4)
Chloride: 101 mmol/L (ref 98–110)
Creat: 0.58 mg/dL (ref 0.50–0.99)
Glucose, Bld: 117 mg/dL — ABNORMAL HIGH (ref 65–99)
Potassium: 3.9 mmol/L (ref 3.5–5.3)
SODIUM: 139 mmol/L (ref 135–146)

## 2016-06-07 LAB — LIPID PANEL
CHOL/HDL RATIO: 4.4 ratio (ref <=5.0)
Cholesterol: 222 mg/dL — ABNORMAL HIGH (ref 125–200)
HDL: 50 mg/dL (ref >=46)
LDL CALC: 142 mg/dL — AB (ref <130)
Triglycerides: 152 mg/dL — ABNORMAL HIGH (ref <150)
VLDL: 30 mg/dL (ref <30)

## 2016-06-07 LAB — HEPATIC FUNCTION PANEL
ALT: 26 U/L (ref 6–29)
AST: 23 U/L (ref 10–35)
Albumin: 4.3 g/dL (ref 3.6–5.1)
Alkaline Phosphatase: 93 U/L (ref 33–130)
BILIRUBIN DIRECT: 0.1 mg/dL (ref <=0.2)
BILIRUBIN INDIRECT: 0.3 mg/dL (ref 0.2–1.2)
Total Bilirubin: 0.4 mg/dL (ref 0.2–1.2)
Total Protein: 6.8 g/dL (ref 6.1–8.1)

## 2016-06-07 LAB — IRON AND TIBC
%SAT: 18 % (ref 11–50)
Iron: 71 ug/dL (ref 45–160)
TIBC: 387 ug/dL (ref 250–450)
UIBC: 316 ug/dL (ref 125–400)

## 2016-06-07 LAB — VITAMIN D 25 HYDROXY (VIT D DEFICIENCY, FRACTURES): Vit D, 25-Hydroxy: 41 ng/mL (ref 30–100)

## 2016-06-07 LAB — HEMOGLOBIN A1C
HEMOGLOBIN A1C: 6.3 % — AB (ref <5.7)
Mean Plasma Glucose: 134 mg/dL

## 2016-06-11 IMAGING — MR MR BREAST BILATERAL W WO CONTRAST
7 of 10 series · 31 of 48 positions shown · IV contrast (multihance)
Comparison: Previous exams

CLINICAL DATA: Patient is post malignant stereotactic left breast
biopsy for microcalcifications demonstrating DCIS.

LABS:  BUN/creatinine 12/0.59 with GFR greater than 89 performed at
[facility] 02/04/14.
EXAM:
BILATERAL BREAST MRI WITH AND WITHOUT CONTRAST
TECHNIQUE: Multiplanar, multisequence MR images of both breasts were obtained
prior to and following the intravenous administration of 20ml of
MultiHance.

[Series 3: T2 · axial · 3.0mm · 0.99mm/px · 1 of 53 slices shown]
[im 1/53]
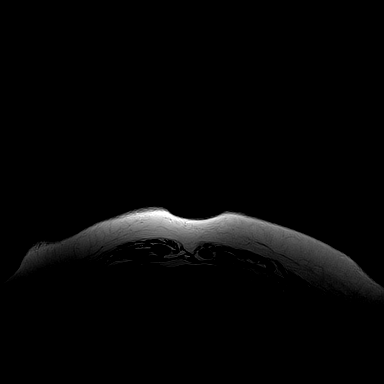

[Series 4: t2_tirm_tra ipat (a-p) · axial · 3.0mm · 0.74mm/px · z∈[-69,+93]mm · 2 of 50 slices shown]
[im 1/50]
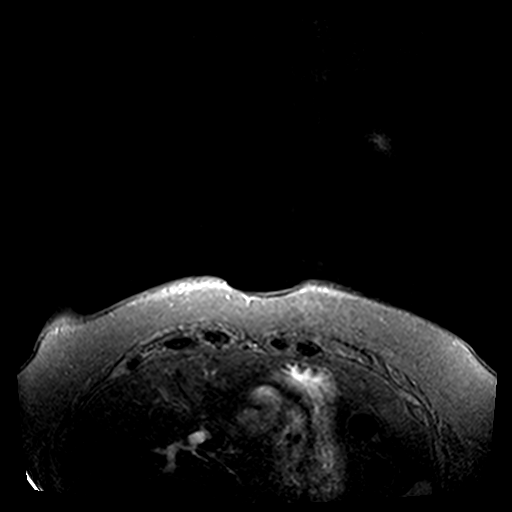
[im 50/50]
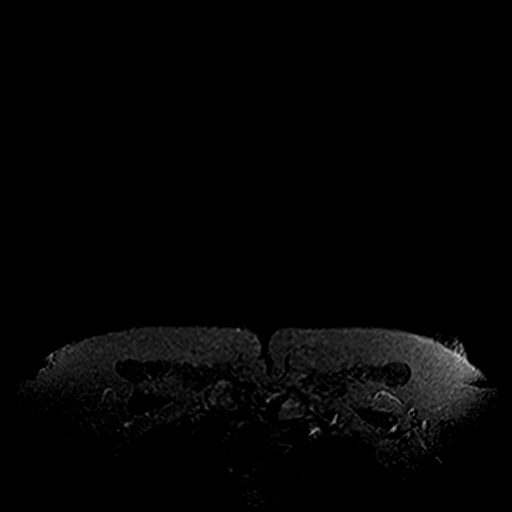

[Series 5: fl3d pre-cm no · axial · non-contrast · 1.2mm · 0.99mm/px · z∈[-74,+97]mm · 6 of 143 slices shown]
[im 1/143]
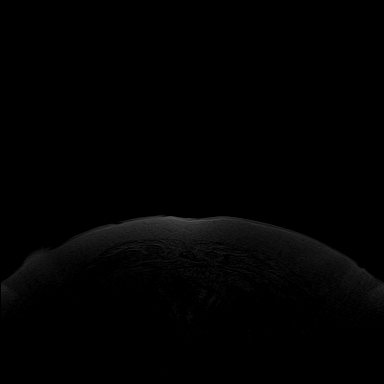
[im 29/143]
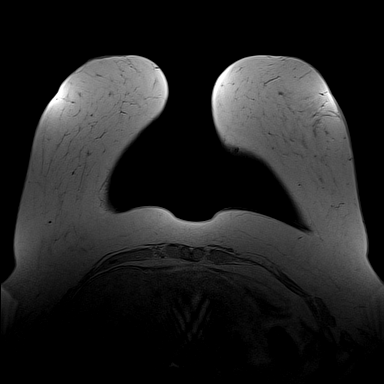
[im 57/143]
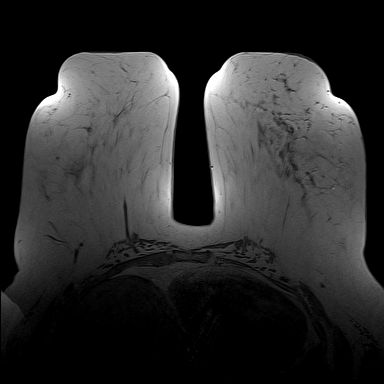
[im 86/143]
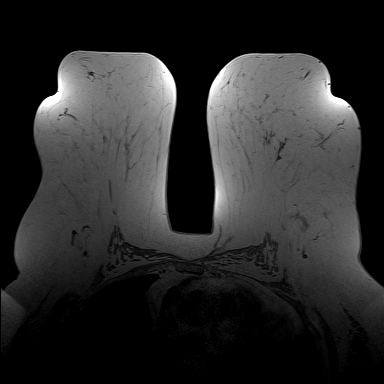
[im 114/143]
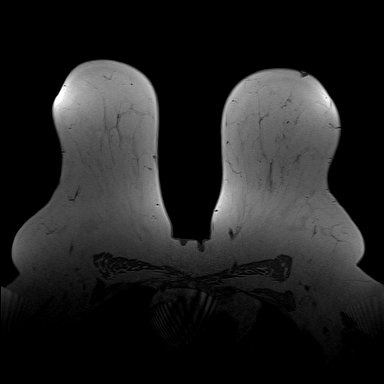
[im 143/143]
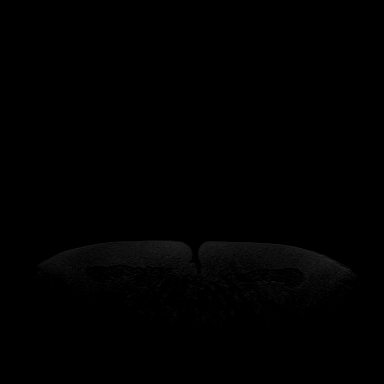

[Series 6: fl3d pre-cm · axial · non-contrast · 1.2mm · 0.99mm/px · z∈[-74,+97]mm · 6 of 144 slices shown]
[im 1/144]
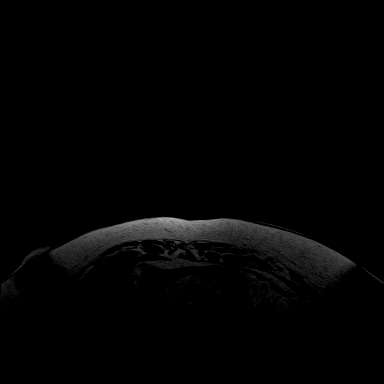
[im 29/144]
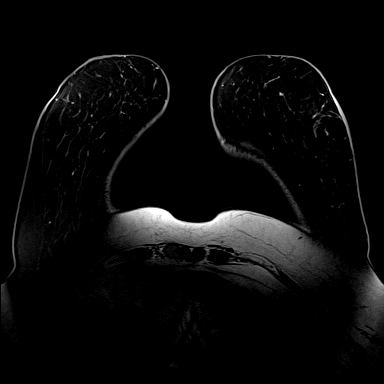
[im 58/144]
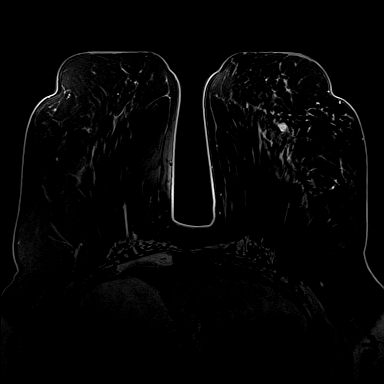
[im 86/144]
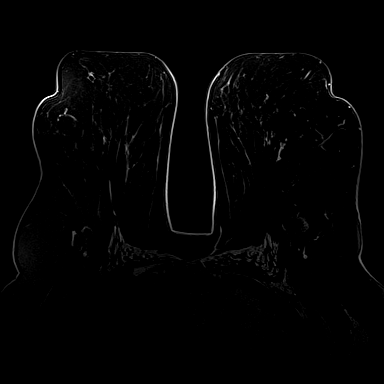
[im 115/144]
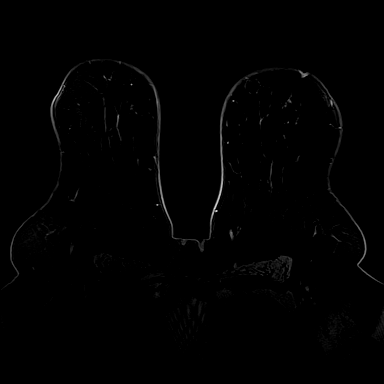
[im 144/144]
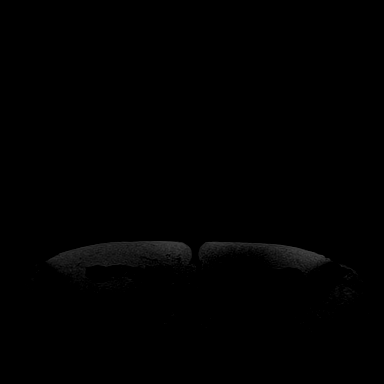

[Series 7: fl3d post-cm 20 · axial · 1.2mm · 0.99mm/px · z∈[-74,+97]mm · 6 of 144 slices shown (1 of 2)]
[im 1/144]
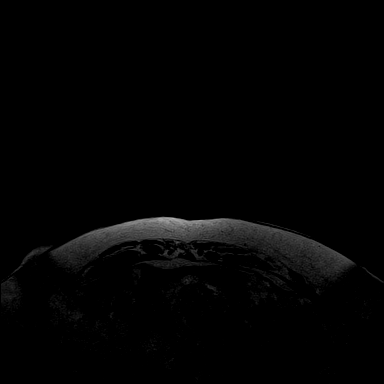
[im 29/144]
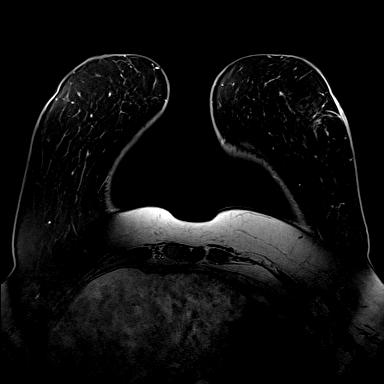
[im 58/144]
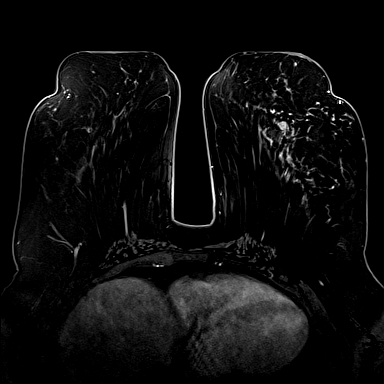
[im 86/144]
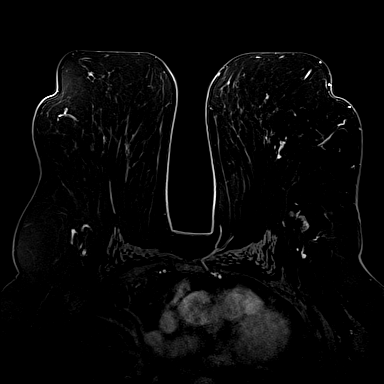
[im 115/144]
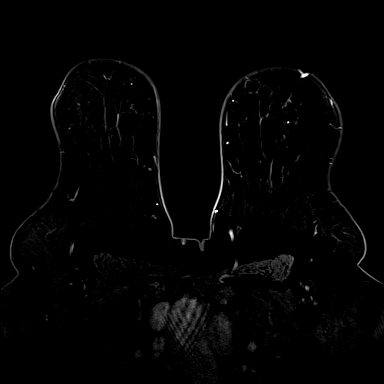
[im 144/144]
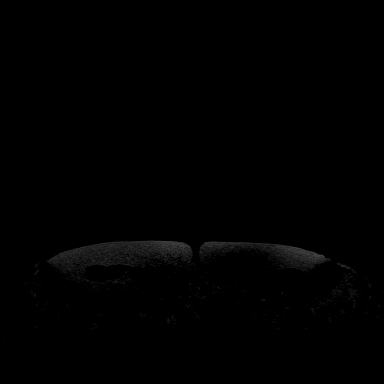

[Series 8: fl3d post-cm 20 · axial · 1.2mm · 0.99mm/px · z∈[-74,+97]mm · 5 of 142 slices shown (2 of 2)]
[im 1/142]
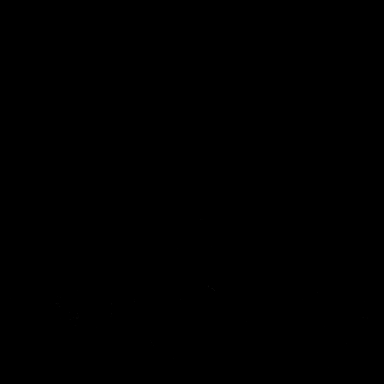
[im 36/142]
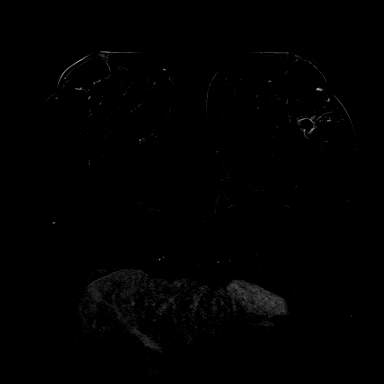
[im 71/142]
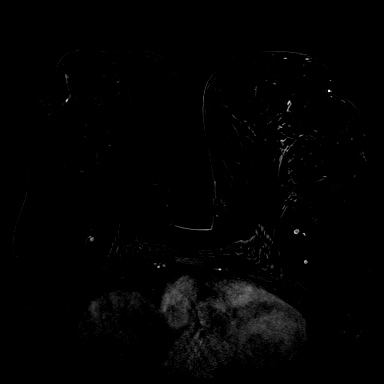
[im 106/142]
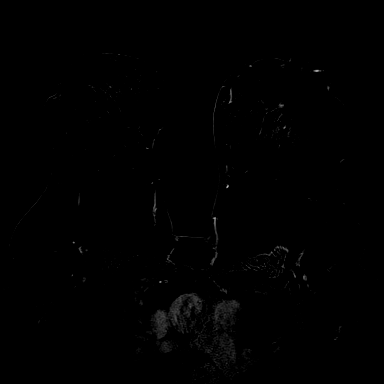
[im 142/142]
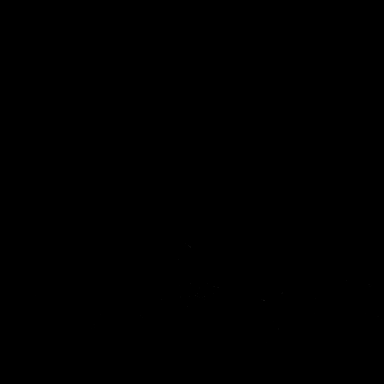

[Series 10: fl3d post-cm 3min · axial · 1.2mm · 0.99mm/px · z∈[-74,+63]mm · 5 of 144 slices shown]
[im 1/144]
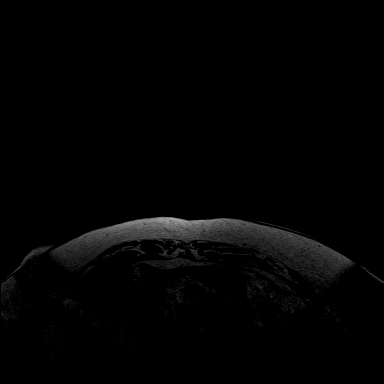
[im 29/144]
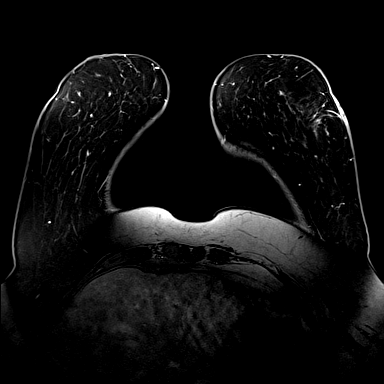
[im 58/144]
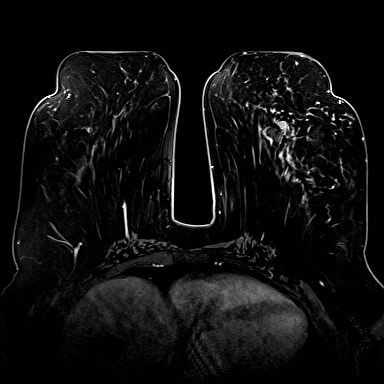
[im 86/144]
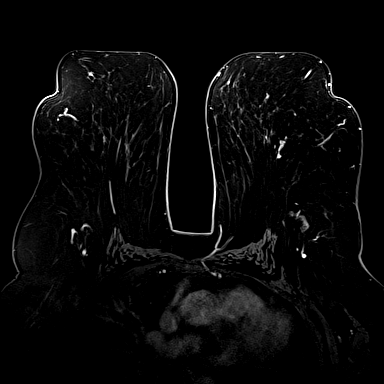
[im 115/144]
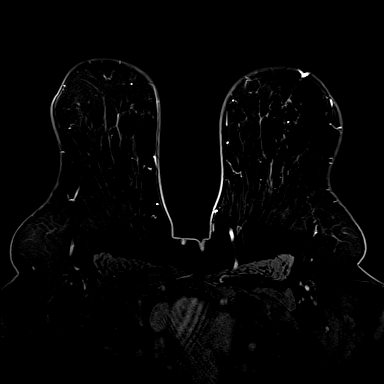

[31 of 48 positions shown; findings below may reference images not displayed]

THREE-DIMENSIONAL MR IMAGE RENDERING ON INDEPENDENT WORKSTATION:

Three-dimensional MR images were rendered by post-processing of the
original MR data on an independent workstation. The
three-dimensional MR images were interpreted, and findings are
reported in the following complete MRI report for this study. Three
dimensional images were evaluated at the independent [employer]
workstation
FINDINGS: Breast composition: b.  Scattered fibroglandular tissue.

Background parenchymal enhancement: Mild.

Right breast: No suspicious mass or abnormal enhancement.

Left breast: Clip artifact is present over the slightly outer mid
portion of the left breast from patient's recent biopsy. Clip
artifact is also present over the inner mid portion of the breast
from previous benign biopsy. A 1.2 cm hematoma is present at the
recent biopsy site over the slightly outer mid portion of the left
breast at approximately the 3 o'clock position.

There is a region of clumped non mass enhancement over the left
breast predominately around the 3 o'clock position extending towards
the upper outer quadrant to the approximate 12 o'clock position and
covering an area measuring approximately 4.5 x 9.3 x 4.8 cm in its
transverse, AP and craniocaudal dimensions. Patient's recent
malignant biopsy site lies centrally within this clumped non mass
enhancement. This enhancement begins approximately 2.7 cm posterior
to the nipple.

Lymph nodes: No abnormal appearing lymph nodes.

Ancillary findings:  None.
IMPRESSION: Region of clumped non mass enhancement over the central left breast
from the 3 o'clock towards the 12 o'clock position measuring
approximately 4.5 x 9.3 x 4.8 cm in its transverse, AP and
craniocaudal dimensions corresponding to patient's biopsy proven
DCIS with clip artifact located centrally within this abnormal
enhancement. This MR enhancement appears slightly more extensive
than the calcifications visualized mammographically.

RECOMMENDATION:
If considering breast conservation surgery, MRI guided biopsy could
be performed along the posterior extent of this enhancement to
document extent of disease. Otherwise, recommend continued follow-up
as per clinical treatment plan.

BI-RADS CATEGORY  6: Known biopsy-proven malignancy.

## 2016-06-21 LAB — FOLATE RBC

## 2016-06-30 IMAGING — CR DG CHEST 2V
2 series · 2 of 2 positions shown · non-contrast
Comparison: None.

CLINICAL DATA: History hypertension.  Preoperative for mastectomy.

EXAM:
CHEST  2 VIEW

[w chest pa]
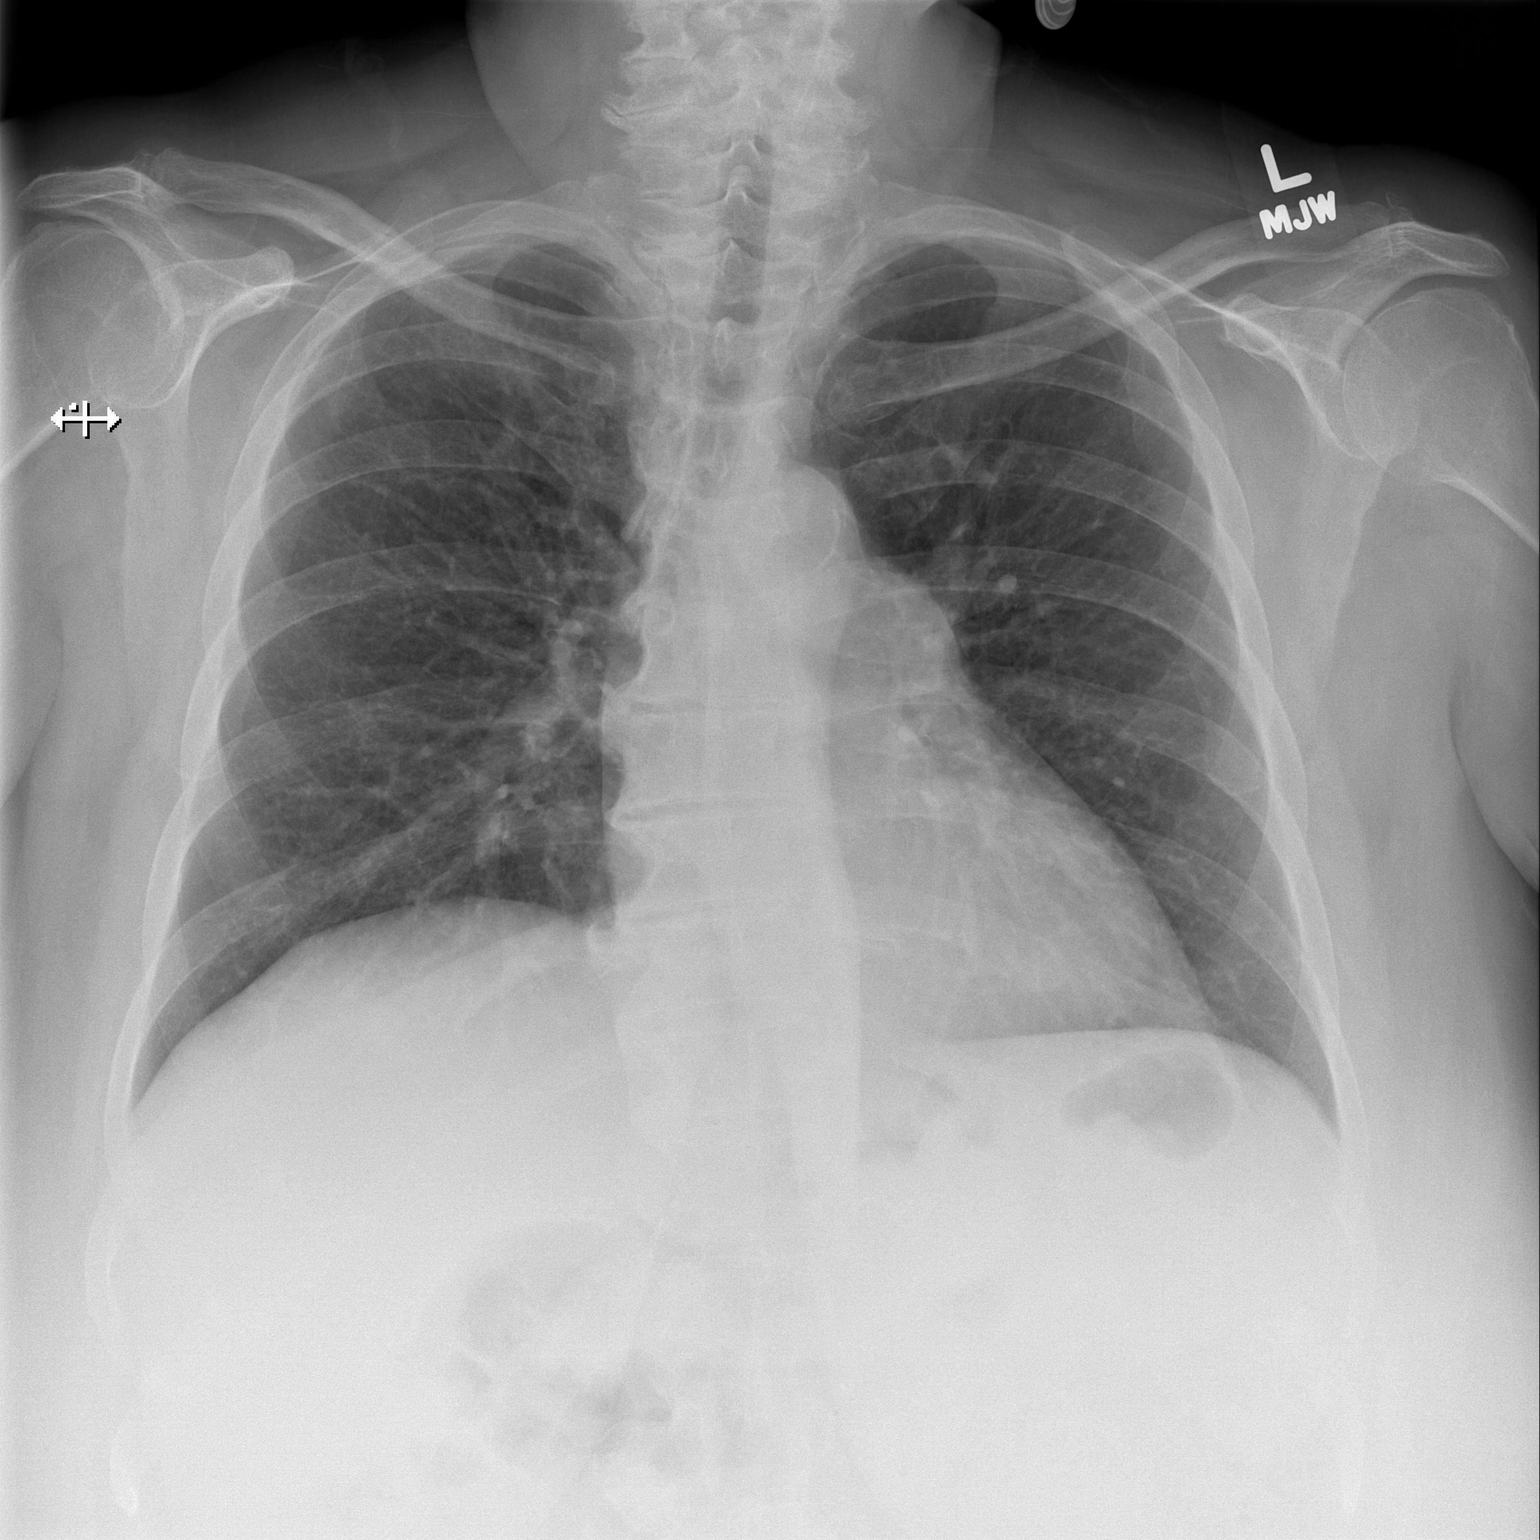

[w chest lat]
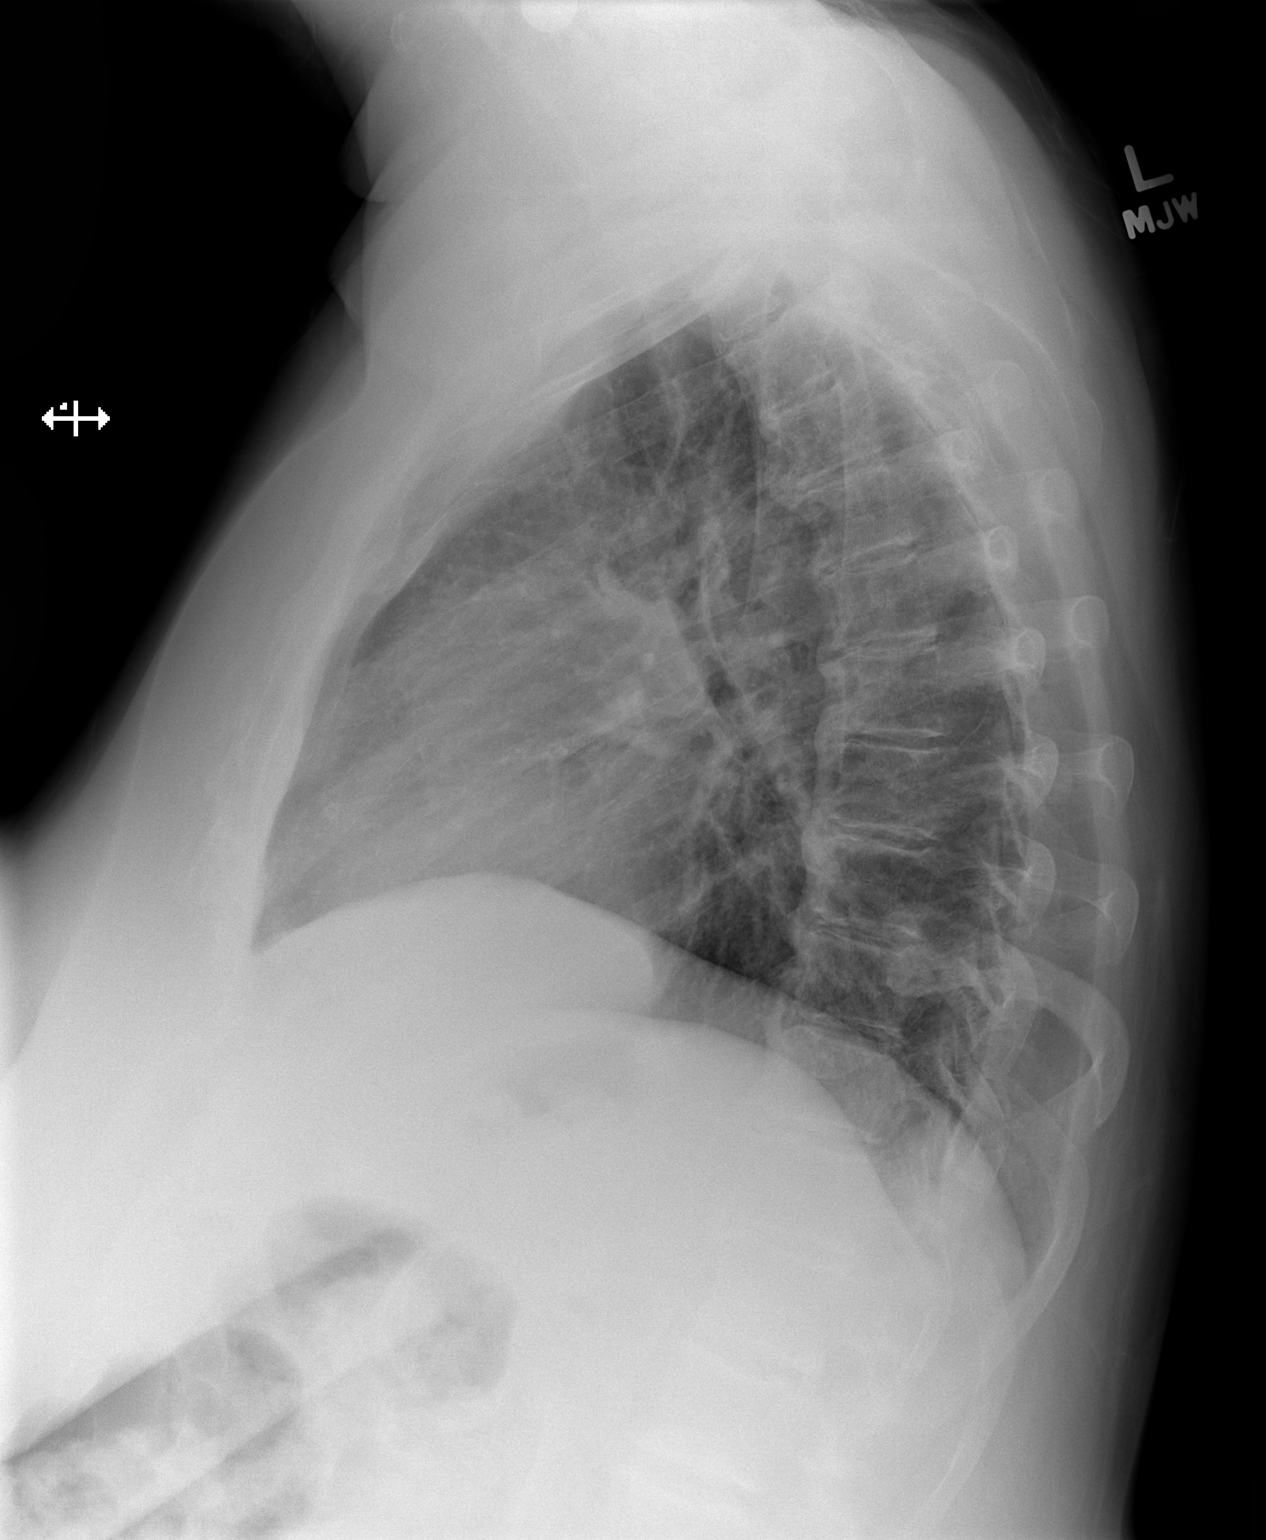

[2 of 2 positions shown; findings below may reference images not displayed]

FINDINGS: The heart size and mediastinal contours are within normal limits.
There is no focal infiltrate, pulmonary edema, or pleural effusion.
There is a 2 mm calcified granuloma in the left mid to lung base.
There is scoliosis and degenerative joint changes of the spine.
IMPRESSION: No active cardiopulmonary disease.

## 2016-07-07 ENCOUNTER — Other Ambulatory Visit: Payer: Self-pay | Admitting: Physician Assistant

## 2016-07-07 MED ORDER — GABAPENTIN 300 MG PO CAPS: ORAL_CAPSULE | ORAL | 2 refills | Status: DC

## 2016-07-07 MED ORDER — NEOMYCIN-POLYMYXIN-HC 3.5-10000-1 OT SOLN: 3.0000 [drp] | OTIC | 3 refills | Status: AC

## 2016-08-24 ENCOUNTER — Other Ambulatory Visit: Payer: Self-pay | Admitting: Physician Assistant

## 2016-08-24 MED ORDER — LOSARTAN POTASSIUM-HCTZ 50-12.5 MG PO TABS: 1.0000 | ORAL_TABLET | Freq: Every day | ORAL | 1 refills | Status: DC

## 2016-12-05 ENCOUNTER — Encounter: Payer: Self-pay | Admitting: Physician Assistant

## 2017-02-24 ENCOUNTER — Encounter: Payer: Self-pay | Admitting: Physician Assistant

## 2017-04-01 ENCOUNTER — Other Ambulatory Visit: Payer: Self-pay | Admitting: Physician Assistant

## 2017-04-01 MED ORDER — HYDROCHLOROTHIAZIDE 25 MG PO TABS: 25.0000 mg | ORAL_TABLET | Freq: Every day | ORAL | 1 refills | Status: DC

## 2017-04-01 MED ORDER — GABAPENTIN 300 MG PO CAPS: ORAL_CAPSULE | ORAL | 2 refills | Status: DC

## 2017-04-27 ENCOUNTER — Other Ambulatory Visit: Payer: Self-pay | Admitting: Physician Assistant

## 2017-04-27 MED ORDER — LOSARTAN POTASSIUM-HCTZ 50-12.5 MG PO TABS: 1.0000 | ORAL_TABLET | Freq: Every day | ORAL | 1 refills | Status: DC

## 2017-04-27 MED ORDER — ATORVASTATIN CALCIUM 40 MG PO TABS: 40.0000 mg | ORAL_TABLET | Freq: Every day | ORAL | 1 refills | Status: DC

## 2017-12-03 ENCOUNTER — Encounter: Payer: Self-pay | Admitting: Adult Health

## 2017-12-03 DIAGNOSIS — G4700 Insomnia, unspecified: Secondary | ICD-10-CM | POA: Insufficient documentation

## 2017-12-03 DIAGNOSIS — D649 Anemia, unspecified: Secondary | ICD-10-CM | POA: Insufficient documentation

## 2017-12-03 NOTE — Progress Notes (Signed)
MEDICARE ANNUAL WELLNESS VISIT AND ENCOUNTER TO REESTABLISH CARE  Assessment:   Diagnoses and all orders for this visit:  Welcome to Medicare preventive visit  Encounter to establish care with new doctor  Essential hypertension Continue medications Monitor blood pressure at home; call if consistently over 130/80 Continue DASH diet.   Reminder to go to the ER if any CP, SOB, nausea, dizziness, severe HA, changes vision/speech, left arm numbness and tingling and jaw pain. -     Magnesium -     EKG -     [place] AAA screening  Vitamin D deficiency Continue supplementation for goal of 70-100 -     VITAMIN D 25 Hydroxy (Vit-D Deficiency, Fractures)  Prediabetes Discussed disease and risks Discussed diet/exercise, weight management  -     Hemoglobin A1c  Arthritis Managed by OTC analgesics Refer to ortho as needed  Personal history of breast cancer s/p bilateral mastectomy Has been released by oncology Residual tissue with extensive scarring/irregular on manual exam; discussed possible benefit of US/MRI evaluation for follow up at some point as she is no longer a MMG candidate - defer at this time  Morbid obesity ([place]) Long discussion about weight loss, diet, and exercise Recommended diet heavy in fruits and veggies and low in animal meats, cheeses, and dairy products, appropriate calorie intake Discussed appropriate weight for height and initial goal (247 lb) Follow up at next visit in 3 months  Hyperlipidemia, unspecified hyperlipidemia type Continue medications - atorvastatin  Continue low cholesterol diet and exercise.   -     Lipid panel -     TSH  Recurrent major depressive disorder, in remission ([place]) [facility] Continue medications  Lifestyle discussed: diet/exerise, sleep hygiene, stress management, hydration  Environmental allergies Continue OTC allergy pills  Insomnia, unspecified type Currently on many agents; trazodone, melatonin, benadryl, gabapentin   Apparently lifelong ongoing issue, discussed ? Need to see specialist for sleep study - declines at this time Insomnia- good sleep hygiene discussed, increase day time activity  Anemia due to other cause, not classified -     CBC with Differential/Platelet -     Vitamin B12 -     Iron,Total/Total Iron Binding Cap -     Folate RBC  Medication management -     CBC with Differential/Platelet -     BASIC METABOLIC PANEL WITH GFR -     Hepatic function panel -     Urinalysis w microscopic + reflex cultur  Estrogen deficiency Dexa scan ordered  Need for pneumococcal vaccination 13-valent not available today in office; will proceed with 23 valent and follow up next year for 13-valent  Over 40 minutes of exam, counseling, chart review and critical decision making was performed Future Appointments  Date Time Provider [place]  03/13/2018 10:30 AM Liane Comber, NP [facility] None     Plan:   During the course of the visit the patient was educated and counseled about appropriate screening and preventive services including:    Pneumococcal vaccine   Prevnar 13  Influenza vaccine  Td vaccine  Screening electrocardiogram  Bone densitometry screening  Colorectal cancer screening  Diabetes screening  Glaucoma screening  Nutrition counseling   Advanced directives: requested   Subjective:  Brooke Hoover is a 67 y.o. female who presents for Medicare Annual Wellness Visit and to reestablish care after being lost to follow up in 2015. [facility] for past several years. She  has ongoing depression and severe insomnia issues for which she takes multiple agents.   BMI is Body mass index is 45.53 kg/m., she has been working on diet and exercise.  Wt Readings from Last 3 Encounters:  12/04/17 257 lb (116.6 kg)  04/25/14 215 lb 9.6 oz (97.8 kg)  04/20/14 213 lb 3.2 oz (96.7 kg)    Her blood pressure has been  controlled at home, today their BP is BP: 118/76 She does workout. She denies chest pain, shortness of breath, dizziness.   She is on cholesterol medication (atorvastatin 40 mg daily) and denies myalgias. Her cholesterol is not at goal. The cholesterol last visit was:   Lab Results  Component Value Date   CHOL 222 (H) 06/06/2016   HDL 50 06/06/2016   LDLCALC 142 (H) 06/06/2016   TRIG 152 (H) 06/06/2016   CHOLHDL 4.4 06/06/2016    She has been working on diet and exercise for prediabetes, and denies foot ulcerations, increased appetite, nausea, paresthesia of the feet, polydipsia, polyuria, visual disturbances, vomiting and weight loss. Last A1C in the office was:  Lab Results  Component Value Date   HGBA1C 6.3 (H) 06/06/2016   Last GFR: Lab Results  Component Value Date   GFRNONAA >89 06/06/2016   Patient is on Vitamin D supplement but remained below goal at last check:    Lab Results  Component Value Date   VD25OH 41 06/06/2016      Medication Review: Current Outpatient Medications on File Prior to Visit  Medication Sig Dispense Refill  . acetaminophen (TYLENOL) 650 MG CR tablet Take 650 mg by mouth every 8 (eight) hours as needed for pain.    Marland Kitchen atorvastatin (LIPITOR) 40 MG tablet Take 1 tablet (40 mg total) by mouth daily. 90 tablet 1  . BIOTIN PO Take 1 tablet by mouth daily.    Marland Kitchen buPROPion (WELLBUTRIN XL) 150 MG 24 hr tablet Take 150 mg by mouth daily.    . cetirizine (ZYRTEC) 10 MG tablet Take 10 mg by mouth daily.    . Cholecalciferol (VITAMIN D PO) Take 1 tablet by mouth daily.    . Cyanocobalamin (B-12 PO) Take by mouth.    . gabapentin (NEURONTIN) 300 MG capsule 1-2 tablets at night 180 capsule 2  . hydrochlorothiazide (HYDRODIURIL) 25 MG tablet Take 1 tablet (25 mg total) by mouth daily. 90 tablet 1  . losartan-hydrochlorothiazide (HYZAAR) 50-12.5 MG tablet Take 1 tablet by mouth daily. 90 tablet 1  . Melatonin 1 MG CAPS Take 3 mg by mouth at bedtime.     .  Multiple Vitamins-Minerals (MULTIVITAMIN PO) Take 1 tablet by mouth daily.    Marland Kitchen neomycin-polymyxin-hydrocortisone (CORTISPORIN) otic solution Place 3 drops into both ears once a week. 10 mL 3  . sertraline (ZOLOFT) 100 MG tablet Take 200 mg by mouth daily.     . traZODone (DESYREL) 100 MG tablet Take 200 mg by mouth at bedtime.     No current facility-administered medications on file prior to visit.     Allergies  Allergen Reactions  . Lisinopril Diarrhea    Pt. Stated, "ACE inhibitors give me diarrhea"  . Morphine And Related Other (See Comments)    Dry heaves    Current Problems (verified) Patient Active Problem List   Diagnosis Date Noted  . Insomnia 12/03/2017  . Anemia 12/03/2017  . Personal history of breast cancer s/p bilateral mastectomy 03/31/2014  . Prediabetes   . Hypertension   . Hyperlipidemia   .  Morbid obesity ([place])   . Environmental allergies   . Depression   . Arthritis   . Vitamin D deficiency     Screening Tests Immunization History  Administered Date(s) Administered  . Influenza-Unspecified 07/04/2013  . Pneumococcal Polysaccharide-23 12/04/2017  . Tdap 11/05/2013   Preventative care: Last colonoscopy: 08/2015 - due 3 years - done at [place] Last mammogram: s/p bilateral mastectomy Last pap smear/pelvic exam: Has had annually, report hx of cervical cancer, s/p hysterectomy DEXA: Remote  Prior vaccinations: TD or Tdap: 2015  Influenza: 2018  Pneumococcal: 2004,  Prevnar13: DUE - unavailable in office Shingles/Zostavax: declines  Names of Other Physician/Practitioners you currently use: 1. [facility] here for primary care 2. , eye doctor, last visit remote  3. Jule Ser, dentist, last visit 2018  Patient Care Team: Unk Pinto, MD as PCP - General (Internal Medicine)  SURGICAL HISTORY She  has a past surgical history that includes Cholecystectomy; Abdominal hysterectomy; Roux-en-y procedure  (1998); Tubal ligation; Complete mastectomy w/ sentinel node biopsy (Bilateral, 03/31/2014); and Mastectomy w/ sentinel node biopsy (Bilateral, 03/31/2014). FAMILY HISTORY Her family history includes COPD in her brother; Cancer in her father and sister; Cancer (age of onset: 90) in her sister; Depression in her brother, mother, sister, sister, and sister; Heart disease in her brother; Hyperlipidemia in her brother, mother, and sister; Hypertension in her brother, mother, and sister; Stroke in her mother. SOCIAL HISTORY She  reports that she is a non-smoker but has been exposed to tobacco smoke. She has never used smokeless tobacco. She reports that she does not drink alcohol or use drugs.   MEDICARE WELLNESS OBJECTIVES: Physical activity: Current Exercise Habits: Home exercise routine, Type of exercise: walking, Time (Minutes): 45, Frequency (Times/Week): 5, Weekly Exercise (Minutes/Week): 225, Intensity: Moderate, Exercise limited by: orthopedic condition(s) Cardiac risk factors: Cardiac Risk Factors include: advanced age (>65men, >26 women);dyslipidemia;hypertension;obesity (BMI >30kg/m2) Depression/mood screen:   Depression screen [facility] 2/9 12/04/2017  Decreased Interest 1  Down, Depressed, Hopeless 3  PHQ - 2 Score 4  Altered sleeping 3  Tired, decreased energy 1  Change in appetite 2  Feeling bad or failure about yourself  3  Trouble concentrating 3  Moving slowly or fidgety/restless 1  Suicidal thoughts 0  PHQ-9 Score 17  Difficult doing work/chores Not difficult at all    ADLs:  In your present state of health, do you have any difficulty performing the following activities: 12/04/2017  Hearing? N  Vision? N  Difficulty concentrating or making decisions? N  Walking or climbing stairs? N  Dressing or bathing? N  Doing errands, shopping? N  Some recent data might be hidden     Cognitive Testing  Alert? Yes  Normal Appearance?Yes  Oriented to person? Yes  Place? Yes   Time?  Yes  Recall of three objects?  Yes  Can perform simple calculations? Yes  Displays appropriate judgment?Yes  Can read the correct time from a watch face?Yes  EOL planning: Does Patient Have a Medical Advance Directive?: Yes Type of Advance Directive: Healthcare Power of Attorney, Living will Does patient want to make changes to medical advance directive?: No - Patient declined Copy of [place] in Chart?: No - copy requested  Review of Systems  Constitutional: Negative for malaise/fatigue and weight loss.  HENT: Negative for hearing loss and tinnitus.   Eyes: Negative for blurred vision and double vision.  Respiratory: Negative for cough, sputum production, shortness of breath and wheezing.   Cardiovascular:  Negative for chest pain, palpitations, orthopnea, claudication, leg swelling and PND.  Gastrointestinal: Negative for abdominal pain, blood in stool, constipation, diarrhea, heartburn, melena, nausea and vomiting.  Genitourinary: Negative.   Musculoskeletal: Negative for falls, joint pain and myalgias.  Skin: Negative for rash.  Neurological: Negative for dizziness, tingling, sensory change, weakness and headaches.  Endo/Heme/Allergies: Negative for polydipsia.  Psychiatric/Behavioral: Positive for depression. Negative for memory loss, substance abuse and suicidal ideas. The patient has insomnia. The patient is not nervous/anxious.   All other systems reviewed and are negative.    Objective:     Today's Vitals   12/04/17 1001  BP: 118/76  Pulse: 91  Temp: (!) 97.5 F (36.4 C)  SpO2: 95%  Weight: 257 lb (116.6 kg)  Height: 5\' 3"  (1.6 m)   Body mass index is 45.53 kg/m.  General appearance: alert, no distress, WD/WN, obese female HEENT: normocephalic, sclerae anicteric, TMs pearly, nares patent, no discharge or erythema, pharynx normal Oral cavity: MMM, no lesions Neck: supple, no lymphadenopathy, no thyromegaly, no masses Heart: RRR, normal S1, S2,  no murmurs Lungs: CTA bilaterally, no wheezes, rhonchi, or rales Abdomen: +bs, soft, non tender, non distended, no masses, no hepatomegaly, no splenomegaly Musculoskeletal: nontender, no swelling, no obvious deformity Breasts: S/p bilateral mastectomy; no palpable lumps though exam is difficult to post-surgical scarring and irregular tissue distribution.  Extremities: no edema, no cyanosis, no clubbing Pulses: 2+ symmetric, upper and lower extremities, normal cap refill Neurological: alert, oriented x 3, CN2-12 intact, strength normal upper extremities and lower extremities, sensation normal throughout, DTRs 2+ throughout, no cerebellar signs, gait normal Psychiatric: normal affect, behavior normal, pleasant   Medicare Attestation I have personally reviewed: The patient's medical and social history Their use of alcohol, tobacco or illicit drugs Their current medications and supplements The patient's functional ability including ADLs,fall risks, home safety risks, cognitive, and hearing and visual impairment Diet and physical activities Evidence for depression or mood disorders  The patient's weight, height, BMI, and visual acuity have been recorded in the chart.  I have made referrals, counseling, and provided education to the patient based on review of the above and I have provided the patient with a written personalized care plan for preventive services.     [name], NP   12/04/2017

## 2017-12-04 ENCOUNTER — Encounter: Payer: Self-pay | Admitting: Adult Health

## 2017-12-04 ENCOUNTER — Ambulatory Visit (INDEPENDENT_AMBULATORY_CARE_PROVIDER_SITE_OTHER): Payer: Medicare Other | Admitting: Adult Health

## 2017-12-04 VITALS — BP 118/76 | HR 91 | Temp 97.5°F | Ht 63.0 in | Wt 257.0 lb

## 2017-12-04 DIAGNOSIS — Z136 Encounter for screening for cardiovascular disorders: Secondary | ICD-10-CM | POA: Diagnosis not present

## 2017-12-04 DIAGNOSIS — R7303 Prediabetes: Secondary | ICD-10-CM | POA: Diagnosis not present

## 2017-12-04 DIAGNOSIS — R6889 Other general symptoms and signs: Secondary | ICD-10-CM | POA: Diagnosis not present

## 2017-12-04 DIAGNOSIS — Z7689 Persons encountering health services in other specified circumstances: Secondary | ICD-10-CM

## 2017-12-04 DIAGNOSIS — E785 Hyperlipidemia, unspecified: Secondary | ICD-10-CM | POA: Diagnosis not present

## 2017-12-04 DIAGNOSIS — E538 Deficiency of other specified B group vitamins: Secondary | ICD-10-CM

## 2017-12-04 DIAGNOSIS — Z0001 Encounter for general adult medical examination with abnormal findings: Secondary | ICD-10-CM

## 2017-12-04 DIAGNOSIS — M1990 Unspecified osteoarthritis, unspecified site: Secondary | ICD-10-CM

## 2017-12-04 DIAGNOSIS — D6489 Other specified anemias: Secondary | ICD-10-CM

## 2017-12-04 DIAGNOSIS — Z853 Personal history of malignant neoplasm of breast: Secondary | ICD-10-CM

## 2017-12-04 DIAGNOSIS — Z9109 Other allergy status, other than to drugs and biological substances: Secondary | ICD-10-CM

## 2017-12-04 DIAGNOSIS — G4700 Insomnia, unspecified: Secondary | ICD-10-CM

## 2017-12-04 DIAGNOSIS — E559 Vitamin D deficiency, unspecified: Secondary | ICD-10-CM

## 2017-12-04 DIAGNOSIS — Z23 Encounter for immunization: Secondary | ICD-10-CM | POA: Diagnosis not present

## 2017-12-04 DIAGNOSIS — Z0000 Encounter for general adult medical examination without abnormal findings: Secondary | ICD-10-CM

## 2017-12-04 DIAGNOSIS — E2839 Other primary ovarian failure: Secondary | ICD-10-CM

## 2017-12-04 DIAGNOSIS — Z79899 Other long term (current) drug therapy: Secondary | ICD-10-CM | POA: Diagnosis not present

## 2017-12-04 DIAGNOSIS — F3340 Major depressive disorder, recurrent, in remission, unspecified: Secondary | ICD-10-CM | POA: Diagnosis not present

## 2017-12-04 DIAGNOSIS — I10 Essential (primary) hypertension: Secondary | ICD-10-CM | POA: Diagnosis not present

## 2017-12-04 NOTE — Patient Instructions (Signed)
Aim for 7+ servings of fruits and vegetables daily  80+ fluid ounces of water or unsweet tea for healthy kidneys  Limit alcohol to max 1 serving a day - less is better  Limit animal fats in diet for cholesterol and heart health - choose grass fed whenever available  Aim for low stress - take time to unwind and care for your mental health  Aim for 150 min of moderate intensity exercise weekly for heart health, and weights twice weekly for bone health  Aim for 7-9 hours of sleep daily   Are you an emotional eater? Do you eat more when you're feeling stressed? Do you eat when you're not hungry or when you're full? Do you eat to feel better (to calm and soothe yourself when you're sad, mad, bored, anxious, etc.)? Do you reward yourself with food? Do you regularly eat until you've stuffed yourself? Does food make you feel safe? Do you feel like food is a friend? Do you feel powerless or out of control around food?  If you answered yes to some of these questions than it is likely that you are an emotional eater. This is normally a learned behavior and can take time to first recognize the signs and second BREAK THE HABIT. But here is more information and tips to help.   The difference between emotional hunger and physical hunger Emotional hunger can be powerful, so it's easy to mistake it for physical hunger. But there are clues you can look for to help you tell physical and emotional hunger apart.  Emotional hunger comes on suddenly. It hits you in an instant and feels overwhelming and urgent. Physical hunger, on the other hand, comes on more gradually. The urge to eat doesn't feel as dire or demand instant satisfaction (unless you haven't eaten for a very long time).  Emotional hunger craves specific comfort foods. When you're physically hungry, almost anything sounds good-including healthy stuff like vegetables. But emotional hunger craves junk food or sugary snacks that provide an instant  rush. You feel like you need cheesecake or pizza, and nothing else will do.  Emotional hunger often leads to mindless eating. Before you know it, you've eaten a whole bag of chips or an entire pint of ice cream without really paying attention or fully enjoying it. When you're eating in response to physical hunger, you're typically more aware of what you're doing.  Emotional hunger isn't satisfied once you're full. You keep wanting more and more, often eating until you're uncomfortably stuffed. Physical hunger, on the other hand, doesn't need to be stuffed. You feel satisfied when your stomach is full.  Emotional hunger isn't located in the stomach. Rather than a growling belly or a pang in your stomach, you feel your hunger as a craving you can't get out of your head. You're focused on specific textures, tastes, and smells.  Emotional hunger often leads to regret, guilt, or shame. When you eat to satisfy physical hunger, you're unlikely to feel guilty or ashamed because you're simply giving your body what it needs. If you feel guilty after you eat, it's likely because you know deep down that you're not eating for nutritional reasons.  Identify your emotional eating triggers What situations, places, or feelings make you reach for the comfort of food? Most emotional eating is linked to unpleasant feelings, but it can also be triggered by positive emotions, such as rewarding yourself for achieving a goal or celebrating a holiday or happy event. Common causes of  emotional eating include:  Stuffing emotions - Eating can be a way to temporarily silence or "stuff down" uncomfortable emotions, including anger, fear, sadness, anxiety, loneliness, resentment, and shame. While you're numbing yourself with food, you can avoid the difficult emotions you'd rather not feel.  Boredom or feelings of emptiness - Do you ever eat simply to give yourself something to do, to relieve boredom, or as a way to fill a void in  your life? You feel unfulfilled and empty, and food is a way to occupy your mouth and your time. In the moment, it fills you up and distracts you from underlying feelings of purposelessness and dissatisfaction with your life.  Childhood habits - Think back to your childhood memories of food. Did your parents reward good behavior with ice cream, take you out for pizza when you got a good report card, or serve you sweets when you were feeling sad? These habits can often carry over into adulthood. Or your eating may be driven by nostalgia-for cherished memories of grilling burgers in the backyard with your dad or baking and eating cookies with your mom.  Social influences - Getting together with other people for a meal is a great way to relieve stress, but it can also lead to overeating. It's easy to overindulge simply because the food is there or because everyone else is eating. You may also overeat in social situations out of nervousness. Or perhaps your family or circle of friends encourages you to overeat, and it's easier to go along with the group.  Stress - Ever notice how stress makes you hungry? It's not just in your mind. When stress is chronic, as it so often is in our chaotic, fast-paced world, your body produces high levels of the stress hormone, cortisol. Cortisol triggers cravings for salty, sweet, and fried foods-foods that give you a burst of energy and pleasure. The more uncontrolled stress in your life, the more likely you are to turn to food for emotional relief.  Find other ways to feed your feelings If you don't know how to manage your emotions in a way that doesn't involve food, you won't be able to control your eating habits for very long. Diets so often fail because they offer logical nutritional advice which only works if you have conscious control over your eating habits. It doesn't work when emotions hijack the process, demanding an immediate payoff with food.  In order to stop  emotional eating, you have to find other ways to fulfill yourself emotionally. It's not enough to understand the cycle of emotional eating or even to understand your triggers, although that's a huge first step. You need alternatives to food that you can turn to for emotional fulfillment.  Alternatives to emotional eating If you're depressed or lonely, call someone who always makes you feel better, play with your dog or cat, or look at a favorite photo or cherished memento.  If you're anxious, expend your nervous energy by dancing to your favorite song, squeezing a stress ball, or taking a brisk walk.  If you're exhausted, treat yourself with a hot cup of tea, take a bath, light some scented candles, or wrap yourself in a warm blanket.  If you're bored, read a good book, watch a comedy show, explore the outdoors, or turn to an activity you enjoy (woodworking, playing the guitar, shooting hoops, scrapbooking, etc.).  What is mindful eating? Mindful eating is a practice that develops your awareness of eating habits and allows you to  pause between your triggers and your actions. Most emotional eaters feel powerless over their food cravings. When the urge to eat hits, you feel an almost unbearable tension that demands to be fed, right now. Because you've tried to resist in the past and failed, you believe that your willpower just isn't up to snuff. But the truth is that you have more power over your cravings than you think.  Take 5 before you give in to a craving Emotional eating tends to be automatic and virtually mindless. Before you even realize what you're doing, you've reached for a tub of ice cream and polished off half of it. But if you can take a moment to pause and reflect when you're hit with a craving, you give yourself the opportunity to make a different decision.  Can you put off eating for five minutes? Or just start with one minute. Don't tell yourself you can't give in to the craving;  remember, the forbidden is extremely tempting. Just tell yourself to wait.  While you're waiting, check in with yourself. How are you feeling? What's going on emotionally? Even if you end up eating, you'll have a better understanding of why you did it. This can help you set yourself up for a different response next time.  How to practice mindful eating Eating while you're also doing other things-such as watching TV, driving, or playing with your phone-can prevent you from fully enjoying your food. Since your mind is elsewhere, you may not feel satisfied or continue eating even though you're no longer hungry. Eating more mindfully can help focus your mind on your food and the pleasure of a meal and curb overeating.   Eat your meals in a calm place with no distractions, aside from any dining companions.  Try eating with your non-dominant hand or using chopsticks instead of a knife and fork. Eating in such a non-familiar way can slow down how fast you eat and ensure your mind stays focused on your food.  Allow yourself enough time not to have to rush your meal. Set a timer for 20 minutes and pace yourself so you spend at least that much time eating.  Take small bites and chew them well, taking time to notice the different flavors and textures of each mouthful.  Put your utensils down between bites. Take time to consider how you feel-hungry, satiated-before picking up your utensils again.  Try to stop eating before you are full.It takes time for the signal to reach your brain that you've had enough. Don't feel obligated to always clean your plate.  When you've finished your food, take a few moments to assess if you're really still hungry before opting for an extra serving or dessert.  Learn to accept your feelings-even the bad ones  While it may seem that the core problem is that you're powerless over food, emotional eating actually stems from feeling powerless over your emotions. You don't feel  capable of dealing with your feelings head on, so you avoid them with food.  Recommended reading  Mini Habits for weight loss  Healthy Eating: A guide to the new nutrition - [place] Report  10 Tips for Mindful Eating - How mindfulness can help you fully enjoy a meal and the experience of eating-with moderation and restraint. ([place])  Weight Loss: Gain Control of Emotional Eating - Tips to regain control of your eating habits. [facility])  Why Stress Causes People to Overeat -Tips on controlling  stress eating. ([place])  Mindful Eating Meditations -Free online mindfulness meditations. ([facility] [employer] [facility])

## 2017-12-05 ENCOUNTER — Other Ambulatory Visit: Payer: Self-pay | Admitting: Adult Health

## 2017-12-05 DIAGNOSIS — N182 Chronic kidney disease, stage 2 (mild): Secondary | ICD-10-CM

## 2017-12-05 DIAGNOSIS — E785 Hyperlipidemia, unspecified: Secondary | ICD-10-CM

## 2017-12-05 DIAGNOSIS — E1122 Type 2 diabetes mellitus with diabetic chronic kidney disease: Secondary | ICD-10-CM

## 2017-12-05 MED ORDER — METFORMIN HCL ER 500 MG PO TB24: ORAL_TABLET | ORAL | 1 refills | Status: DC

## 2017-12-05 MED ORDER — ATORVASTATIN CALCIUM 80 MG PO TABS: 80.0000 mg | ORAL_TABLET | Freq: Every day | ORAL | 1 refills | Status: DC

## 2017-12-06 LAB — CBC WITH DIFFERENTIAL/PLATELET
BASOS PCT: 0.3 %
Basophils Absolute: 29 cells/uL (ref 0–200)
EOS PCT: 1 %
Eosinophils Absolute: 96 cells/uL (ref 15–500)
HEMATOCRIT: 42.2 % (ref 35.0–45.0)
HEMOGLOBIN: 14.8 g/dL (ref 11.7–15.5)
LYMPHS ABS: 2160 {cells}/uL (ref 850–3900)
MCH: 29.6 pg (ref 27.0–33.0)
MCHC: 35.1 g/dL (ref 32.0–36.0)
MCV: 84.4 fL (ref 80.0–100.0)
MPV: 10.6 fL (ref 7.5–12.5)
Monocytes Relative: 6.5 %
NEUTROS ABS: 6691 {cells}/uL (ref 1500–7800)
NEUTROS PCT: 69.7 %
Platelets: 317 10*3/uL (ref 140–400)
RBC: 5 10*6/uL (ref 3.80–5.10)
RDW: 13.1 % (ref 11.0–15.0)
Total Lymphocyte: 22.5 %
WBC: 9.6 10*3/uL (ref 3.8–10.8)
WBCMIX: 624 {cells}/uL (ref 200–950)

## 2017-12-06 LAB — IRON, TOTAL/TOTAL IRON BINDING CAP
%SAT: 25 % (calc) (ref 11–50)
Iron: 94 ug/dL (ref 45–160)
TIBC: 380 mcg/dL (calc) (ref 250–450)

## 2017-12-06 LAB — HEMOGLOBIN A1C
HEMOGLOBIN A1C: 7.1 %{Hb} — AB (ref <5.7)
Mean Plasma Glucose: 157 (calc)
eAG (mmol/L): 8.7 (calc)

## 2017-12-06 LAB — URINALYSIS W MICROSCOPIC + REFLEX CULTURE
Bacteria, UA: NONE SEEN /HPF
Bilirubin Urine: NEGATIVE
Glucose, UA: NEGATIVE
Hgb urine dipstick: NEGATIVE
Hyaline Cast: NONE SEEN /LPF
Nitrites, Initial: NEGATIVE
PROTEIN: NEGATIVE
RBC / HPF: NONE SEEN /HPF (ref 0–2)
Specific Gravity, Urine: 1.02 (ref 1.001–1.03)
WBC, UA: NONE SEEN /HPF (ref 0–5)
pH: 6.5 (ref 5.0–8.0)

## 2017-12-06 LAB — BASIC METABOLIC PANEL WITH GFR
BUN: 11 mg/dL (ref 7–25)
CALCIUM: 9.6 mg/dL (ref 8.6–10.4)
CO2: 28 mmol/L (ref 20–32)
Chloride: 99 mmol/L (ref 98–110)
Creat: 0.77 mg/dL (ref 0.50–0.99)
GFR, EST NON AFRICAN AMERICAN: 81 mL/min/{1.73_m2} (ref >=60)
GFR, Est African American: 94 mL/min/{1.73_m2} (ref >=60)
Glucose, Bld: 155 mg/dL — ABNORMAL HIGH (ref 65–99)
POTASSIUM: 4.2 mmol/L (ref 3.5–5.3)
Sodium: 137 mmol/L (ref 135–146)

## 2017-12-06 LAB — HEPATIC FUNCTION PANEL
AG Ratio: 1.8 (calc) (ref 1.0–2.5)
ALBUMIN MSPROF: 4.6 g/dL (ref 3.6–5.1)
ALT: 27 U/L (ref 6–29)
AST: 23 U/L (ref 10–35)
Alkaline phosphatase (APISO): 112 U/L (ref 33–130)
BILIRUBIN TOTAL: 0.5 mg/dL (ref 0.2–1.2)
Bilirubin, Direct: 0.1 mg/dL (ref 0.0–0.2)
GLOBULIN: 2.6 g/dL (ref 1.9–3.7)
Indirect Bilirubin: 0.4 mg/dL (calc) (ref 0.2–1.2)
Total Protein: 7.2 g/dL (ref 6.1–8.1)

## 2017-12-06 LAB — LIPID PANEL
Cholesterol: 211 mg/dL — ABNORMAL HIGH (ref <200)
HDL: 38 mg/dL — AB (ref >50)
LDL Cholesterol (Calc): 135 mg/dL (calc) — ABNORMAL HIGH
NON-HDL CHOLESTEROL (CALC): 173 mg/dL — AB (ref <130)
TRIGLYCERIDES: 236 mg/dL — AB (ref <150)
Total CHOL/HDL Ratio: 5.6 (calc) — ABNORMAL HIGH (ref <5.0)

## 2017-12-06 LAB — URINE CULTURE
MICRO NUMBER: 90362576
SPECIMEN QUALITY: ADEQUATE

## 2017-12-06 LAB — CULTURE INDICATED

## 2017-12-06 LAB — VITAMIN B12: Vitamin B-12: 1273 pg/mL — ABNORMAL HIGH (ref 200–1100)

## 2017-12-06 LAB — FOLATE RBC: RBC Folate: 779 ng/mL RBC (ref >280)

## 2017-12-06 LAB — MAGNESIUM: MAGNESIUM: 1.9 mg/dL (ref 1.5–2.5)

## 2017-12-06 LAB — TSH: TSH: 1.84 mIU/L (ref 0.40–4.50)

## 2017-12-08 ENCOUNTER — Other Ambulatory Visit: Payer: Self-pay | Admitting: Physician Assistant

## 2017-12-08 MED ORDER — GABAPENTIN 300 MG PO CAPS
ORAL_CAPSULE | ORAL | 2 refills | Status: DC
Start: 2017-12-08 — End: 2017-12-23

## 2017-12-08 MED ORDER — LOSARTAN POTASSIUM-HCTZ 50-12.5 MG PO TABS
1.0000 | ORAL_TABLET | Freq: Every day | ORAL | 1 refills | Status: DC
Start: 2017-12-08 — End: 2018-07-21

## 2017-12-22 ENCOUNTER — Other Ambulatory Visit: Payer: Self-pay | Admitting: Physician Assistant

## 2017-12-23 ENCOUNTER — Other Ambulatory Visit: Payer: Self-pay | Admitting: Physician Assistant

## 2017-12-23 MED ORDER — GABAPENTIN 300 MG PO CAPS: ORAL_CAPSULE | ORAL | 2 refills | Status: DC

## 2018-01-13 ENCOUNTER — Other Ambulatory Visit: Payer: Self-pay | Admitting: Internal Medicine

## 2018-03-02 ENCOUNTER — Encounter: Payer: Self-pay | Admitting: Internal Medicine

## 2018-03-13 ENCOUNTER — Other Ambulatory Visit: Payer: BC Managed Care – PPO

## 2018-03-13 ENCOUNTER — Ambulatory Visit: Payer: Self-pay | Admitting: Adult Health

## 2018-07-21 ENCOUNTER — Other Ambulatory Visit: Payer: Self-pay | Admitting: Physician Assistant

## 2018-07-21 ENCOUNTER — Other Ambulatory Visit: Payer: Self-pay | Admitting: Adult Health

## 2018-07-21 DIAGNOSIS — E785 Hyperlipidemia, unspecified: Secondary | ICD-10-CM

## 2018-07-21 DIAGNOSIS — E1122 Type 2 diabetes mellitus with diabetic chronic kidney disease: Secondary | ICD-10-CM

## 2018-07-21 DIAGNOSIS — N182 Chronic kidney disease, stage 2 (mild): Principal | ICD-10-CM

## 2018-12-16 ENCOUNTER — Ambulatory Visit: Payer: Self-pay | Admitting: Adult Health

## 2019-01-07 DIAGNOSIS — N182 Chronic kidney disease, stage 2 (mild): Secondary | ICD-10-CM | POA: Insufficient documentation

## 2019-01-07 DIAGNOSIS — E1122 Type 2 diabetes mellitus with diabetic chronic kidney disease: Secondary | ICD-10-CM | POA: Insufficient documentation
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
## 2019-01-08 ENCOUNTER — Other Ambulatory Visit: Payer: Self-pay | Admitting: Physician Assistant

## 2019-01-08 ENCOUNTER — Other Ambulatory Visit: Payer: Self-pay

## 2019-01-08 ENCOUNTER — Ambulatory Visit: Payer: Medicare Other | Admitting: Adult Health

## 2019-01-08 ENCOUNTER — Encounter: Payer: Self-pay | Admitting: Adult Health

## 2019-01-08 VITALS — BP 137/66 | HR 65 | Wt 244.0 lb

## 2019-01-08 DIAGNOSIS — Z0001 Encounter for general adult medical examination with abnormal findings: Secondary | ICD-10-CM

## 2019-01-08 DIAGNOSIS — R6889 Other general symptoms and signs: Secondary | ICD-10-CM

## 2019-01-08 DIAGNOSIS — E1169 Type 2 diabetes mellitus with other specified complication: Secondary | ICD-10-CM

## 2019-01-08 DIAGNOSIS — G4700 Insomnia, unspecified: Secondary | ICD-10-CM

## 2019-01-08 DIAGNOSIS — E1122 Type 2 diabetes mellitus with diabetic chronic kidney disease: Secondary | ICD-10-CM

## 2019-01-08 DIAGNOSIS — Z853 Personal history of malignant neoplasm of breast: Secondary | ICD-10-CM | POA: Diagnosis not present

## 2019-01-08 DIAGNOSIS — M1990 Unspecified osteoarthritis, unspecified site: Secondary | ICD-10-CM

## 2019-01-08 DIAGNOSIS — E559 Vitamin D deficiency, unspecified: Secondary | ICD-10-CM

## 2019-01-08 DIAGNOSIS — Z0000 Encounter for general adult medical examination without abnormal findings: Secondary | ICD-10-CM

## 2019-01-08 DIAGNOSIS — N182 Chronic kidney disease, stage 2 (mild): Secondary | ICD-10-CM

## 2019-01-08 DIAGNOSIS — E785 Hyperlipidemia, unspecified: Secondary | ICD-10-CM

## 2019-01-08 DIAGNOSIS — I10 Essential (primary) hypertension: Secondary | ICD-10-CM

## 2019-01-08 DIAGNOSIS — F3340 Major depressive disorder, recurrent, in remission, unspecified: Secondary | ICD-10-CM

## 2019-01-08 DIAGNOSIS — Z9109 Other allergy status, other than to drugs and biological substances: Secondary | ICD-10-CM

## 2019-01-08 MED ORDER — EZETIMIBE 10 MG PO TABS: 10.0000 mg | ORAL_TABLET | Freq: Every day | ORAL | 3 refills | Status: DC

## 2019-01-08 MED ORDER — GABAPENTIN 300 MG PO CAPS: ORAL_CAPSULE | ORAL | 2 refills | Status: DC

## 2019-01-11 ENCOUNTER — Other Ambulatory Visit: Payer: Self-pay

## 2019-02-16 ENCOUNTER — Other Ambulatory Visit: Payer: Self-pay | Admitting: Physician Assistant

## 2019-02-16 DIAGNOSIS — E785 Hyperlipidemia, unspecified: Secondary | ICD-10-CM

## 2019-02-16 DIAGNOSIS — N182 Chronic kidney disease, stage 2 (mild): Secondary | ICD-10-CM

## 2019-02-16 DIAGNOSIS — E1122 Type 2 diabetes mellitus with diabetic chronic kidney disease: Secondary | ICD-10-CM

## 2019-02-16 MED ORDER — EZETIMIBE 10 MG PO TABS: 10.0000 mg | ORAL_TABLET | Freq: Every day | ORAL | 3 refills | Status: AC

## 2019-02-16 MED ORDER — METFORMIN HCL ER 500 MG PO TB24
ORAL_TABLET | ORAL | 1 refills | Status: AC
Start: 2019-02-16 — End: ?

## 2019-02-16 MED ORDER — LOSARTAN POTASSIUM-HCTZ 50-12.5 MG PO TABS: 1.0000 | ORAL_TABLET | Freq: Every day | ORAL | 1 refills | Status: AC

## 2019-02-16 MED ORDER — TRAZODONE HCL 100 MG PO TABS: 200.0000 mg | ORAL_TABLET | Freq: Every day | ORAL | 3 refills | Status: AC

## 2019-02-16 MED ORDER — GABAPENTIN 300 MG PO CAPS: ORAL_CAPSULE | ORAL | 2 refills | Status: AC

## 2019-02-16 MED ORDER — ATORVASTATIN CALCIUM 80 MG PO TABS: 80.0000 mg | ORAL_TABLET | Freq: Every day | ORAL | 1 refills | Status: AC

## 2019-02-17 LAB — HM COLONOSCOPY

## 2019-02-23 ENCOUNTER — Encounter: Payer: Self-pay | Admitting: *Deleted

## 2019-03-17 ENCOUNTER — Other Ambulatory Visit: Payer: Self-pay | Admitting: Physician Assistant

## 2019-03-17 MED ORDER — LEVOFLOXACIN 500 MG PO TABS: 500.0000 mg | ORAL_TABLET | Freq: Every day | ORAL | 0 refills | Status: AC

## 2020-01-20 DIAGNOSIS — M170 Bilateral primary osteoarthritis of knee: Secondary | ICD-10-CM | POA: Insufficient documentation
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
## 2020-01-24 ENCOUNTER — Ambulatory Visit: Payer: Medicare Other | Admitting: Adult Health

## 2020-01-24 DIAGNOSIS — E1122 Type 2 diabetes mellitus with diabetic chronic kidney disease: Secondary | ICD-10-CM

## 2020-01-24 DIAGNOSIS — Z853 Personal history of malignant neoplasm of breast: Secondary | ICD-10-CM

## 2020-01-24 DIAGNOSIS — E559 Vitamin D deficiency, unspecified: Secondary | ICD-10-CM

## 2020-01-24 DIAGNOSIS — G4700 Insomnia, unspecified: Secondary | ICD-10-CM

## 2020-01-24 DIAGNOSIS — Z9109 Other allergy status, other than to drugs and biological substances: Secondary | ICD-10-CM

## 2020-01-24 DIAGNOSIS — M170 Bilateral primary osteoarthritis of knee: Secondary | ICD-10-CM

## 2020-01-24 DIAGNOSIS — I10 Essential (primary) hypertension: Secondary | ICD-10-CM

## 2020-01-24 DIAGNOSIS — Z0000 Encounter for general adult medical examination without abnormal findings: Secondary | ICD-10-CM

## 2020-01-24 DIAGNOSIS — E1169 Type 2 diabetes mellitus with other specified complication: Secondary | ICD-10-CM

## 2020-01-24 DIAGNOSIS — N182 Chronic kidney disease, stage 2 (mild): Secondary | ICD-10-CM

## 2020-01-24 DIAGNOSIS — F3340 Major depressive disorder, recurrent, in remission, unspecified: Secondary | ICD-10-CM
